# Patient Record
Sex: Female | Born: 1950 | Race: Black or African American | Hispanic: No | Marital: Single | Smoking: Never smoker
Health system: Southern US, Community
[De-identification: ages and names within clinical notes are randomized; demographics above are authoritative.]

## PROBLEM LIST (undated history)

## (undated) DIAGNOSIS — I10 Essential (primary) hypertension: Secondary | ICD-10-CM

## (undated) HISTORY — DX: Essential (primary) hypertension: I10

---

## 2020-05-15 DIAGNOSIS — I10 Essential (primary) hypertension: Secondary | ICD-10-CM | POA: Diagnosis not present

## 2020-05-15 DIAGNOSIS — Z6836 Body mass index (BMI) 36.0-36.9, adult: Secondary | ICD-10-CM | POA: Diagnosis not present

## 2020-08-13 DIAGNOSIS — Z1331 Encounter for screening for depression: Secondary | ICD-10-CM | POA: Diagnosis not present

## 2020-08-13 DIAGNOSIS — Z1389 Encounter for screening for other disorder: Secondary | ICD-10-CM | POA: Diagnosis not present

## 2020-08-13 DIAGNOSIS — Z0001 Encounter for general adult medical examination with abnormal findings: Secondary | ICD-10-CM | POA: Diagnosis not present

## 2020-08-13 DIAGNOSIS — Z79899 Other long term (current) drug therapy: Secondary | ICD-10-CM | POA: Diagnosis not present

## 2020-08-13 DIAGNOSIS — E785 Hyperlipidemia, unspecified: Secondary | ICD-10-CM | POA: Diagnosis not present

## 2020-08-13 DIAGNOSIS — I10 Essential (primary) hypertension: Secondary | ICD-10-CM | POA: Diagnosis not present

## 2020-08-13 DIAGNOSIS — Z6836 Body mass index (BMI) 36.0-36.9, adult: Secondary | ICD-10-CM | POA: Diagnosis not present

## 2020-09-12 DIAGNOSIS — I10 Essential (primary) hypertension: Secondary | ICD-10-CM | POA: Diagnosis not present

## 2020-09-12 DIAGNOSIS — E785 Hyperlipidemia, unspecified: Secondary | ICD-10-CM | POA: Diagnosis not present

## 2020-10-31 DIAGNOSIS — E785 Hyperlipidemia, unspecified: Secondary | ICD-10-CM | POA: Diagnosis not present

## 2020-10-31 DIAGNOSIS — R7303 Prediabetes: Secondary | ICD-10-CM | POA: Diagnosis not present

## 2020-10-31 DIAGNOSIS — I10 Essential (primary) hypertension: Secondary | ICD-10-CM | POA: Diagnosis not present

## 2020-11-28 DIAGNOSIS — I10 Essential (primary) hypertension: Secondary | ICD-10-CM | POA: Diagnosis not present

## 2020-11-28 DIAGNOSIS — E785 Hyperlipidemia, unspecified: Secondary | ICD-10-CM | POA: Diagnosis not present

## 2020-12-29 DIAGNOSIS — E785 Hyperlipidemia, unspecified: Secondary | ICD-10-CM | POA: Diagnosis not present

## 2020-12-29 DIAGNOSIS — I10 Essential (primary) hypertension: Secondary | ICD-10-CM | POA: Diagnosis not present

## 2021-02-07 ENCOUNTER — Ambulatory Visit (HOSPITAL_COMMUNITY)
Admission: RE | Admit: 2021-02-07 | Discharge: 2021-02-07 | Disposition: A | Discharge status: Home / Self Care | Payer: Medicare Other | Source: Ambulatory Visit | Attending: Gerontology | Admitting: Gerontology

## 2021-02-07 ENCOUNTER — Other Ambulatory Visit (HOSPITAL_COMMUNITY): Payer: Self-pay | Admitting: Gerontology

## 2021-02-07 DIAGNOSIS — M25562 Pain in left knee: Secondary | ICD-10-CM | POA: Diagnosis not present

## 2021-02-07 DIAGNOSIS — E785 Hyperlipidemia, unspecified: Secondary | ICD-10-CM | POA: Diagnosis not present

## 2021-02-07 DIAGNOSIS — I10 Essential (primary) hypertension: Secondary | ICD-10-CM | POA: Diagnosis not present

## 2021-02-07 DIAGNOSIS — R7303 Prediabetes: Secondary | ICD-10-CM | POA: Diagnosis not present

## 2021-02-07 DIAGNOSIS — Z1389 Encounter for screening for other disorder: Secondary | ICD-10-CM | POA: Diagnosis not present

## 2021-02-07 DIAGNOSIS — Z0001 Encounter for general adult medical examination with abnormal findings: Secondary | ICD-10-CM | POA: Diagnosis not present

## 2021-03-10 DIAGNOSIS — I10 Essential (primary) hypertension: Secondary | ICD-10-CM | POA: Diagnosis not present

## 2021-03-10 DIAGNOSIS — E785 Hyperlipidemia, unspecified: Secondary | ICD-10-CM | POA: Diagnosis not present

## 2021-03-11 ENCOUNTER — Other Ambulatory Visit: Payer: Medicare Other | Admitting: Obstetrics & Gynecology

## 2021-03-19 ENCOUNTER — Telehealth: Payer: Self-pay | Admitting: Orthopedic Surgery

## 2021-03-19 NOTE — Telephone Encounter (Signed)
Received referral.  I called and left a message for this patient to call our office back to reschedule an appointment.  Patient called back and we discussed her scheduling an appointment.  She said she has been told that there is nothing that the doctor can do for her leg.  She said she didn't want to pay a 45.00 copay to come in and he tell her that.  She then said that she was told that he could give her an injection but she wasn't sure that she wanted a "foreign substance" in her body.  She went on to say that she wanted him to diagnose her over the phone.  I told her that since he has never seen her, I felt like she needed to be seen and let the doctor exam her to make his diagnosis.  She said she didn't want to come in and pay 45.00 copay to come in and find out that he cant do anything for her.  She then stated that she "wanted to check around" and talk to others.  She said she would call us back.  I called Dr Letitia Neri office an spoke to [place] regarding this conversation

## 2021-03-21 ENCOUNTER — Ambulatory Visit (INDEPENDENT_AMBULATORY_CARE_PROVIDER_SITE_OTHER): Payer: Medicare Other | Admitting: Obstetrics and Gynecology

## 2021-03-21 ENCOUNTER — Encounter: Payer: Self-pay | Admitting: Obstetrics and Gynecology

## 2021-03-21 ENCOUNTER — Other Ambulatory Visit: Payer: Self-pay

## 2021-03-21 VITALS — BP 156/83 | HR 50 | Ht 67.0 in | Wt 227.0 lb

## 2021-03-21 DIAGNOSIS — Z0000 Encounter for general adult medical examination without abnormal findings: Secondary | ICD-10-CM | POA: Diagnosis not present

## 2021-03-21 DIAGNOSIS — R102 Pelvic and perineal pain: Secondary | ICD-10-CM

## 2021-03-21 NOTE — Progress Notes (Signed)
   History:  [name] is a 70 y.o. [id number] who presents to clinic today for establishing care.  Pain in lower pelvis that radiates down leg. Wants her ovaries checked out.  Pain is intermittent but is mainly in her LLQ and radiates down anterior thigh. It is not exacerbated by any leg movement. No pain with urination. Reports regular, daily bowel movements. Reports feeling pelvic fullness associated with pain.   Sees a PCP regularly   Last Pap was in her sixties. All normal pap smears. Has not been sexually active since her mid fifties. Reports that prior to menopause had normal periods. Has not had any breakthrough bleeding since menopause.   The following portions of the patient's history were reviewed and updated as appropriate: allergies, current medications, family history, past medical history, social history, past surgical history and problem list.  Review of Systems:  Review of Systems  Constitutional:  Negative for chills and fever.  Cardiovascular:  Negative for chest pain.  Gastrointestinal:  Negative for blood in stool, constipation, diarrhea and melena.  Genitourinary:  Negative for dysuria, flank pain and hematuria.  Musculoskeletal:  Negative for back pain.  Skin:  Negative for rash.  Neurological:  Negative for dizziness and headaches.     Objective:  Physical Exam BP (!) 156/83 (BP Location: Right Arm, Patient Position: Sitting, Cuff Size: Normal)   Pulse (!) 50   Ht 5\' 7"  (1.702 m)   Wt 227 lb (103 kg)   BMI 35.55 kg/m  Physical Exam Vitals and nursing note reviewed.  Constitutional:      Appearance: Normal appearance.  Cardiovascular:     Pulses: Normal pulses.  Pulmonary:     Effort: Pulmonary effort is normal.  Abdominal:     General: There is no distension.     Palpations: Abdomen is soft. There is no mass.     Tenderness: There is no abdominal tenderness. There is no right CVA tenderness, left CVA tenderness, guarding or rebound.      Hernia: No hernia is present.  Neurological:     Mental Status: She is alert.  Psychiatric:        Mood and Affect: Mood normal.        Behavior: Behavior normal.      Labs and Imaging No results found for this or any previous visit (from the past 24 hour(s)).  No results found.   Assessment & Plan:  1. Encounter for medical examination to establish care, Pelvic Pain -pap smear not indicated -no remarkable findings on exam but given patient reports of feeling fullness and pain, will send for TVUS.      , MD [facility] Family Medicine Fellow, [facility] for [facility], [facility]

## 2021-03-22 ENCOUNTER — Encounter: Payer: Self-pay | Admitting: Obstetrics and Gynecology

## 2021-03-31 ENCOUNTER — Ambulatory Visit (HOSPITAL_COMMUNITY)
Admission: RE | Admit: 2021-03-31 | Discharge: 2021-03-31 | Disposition: A | Discharge status: Home / Self Care | Payer: Medicare Other | Source: Ambulatory Visit | Attending: Obstetrics and Gynecology | Admitting: Obstetrics and Gynecology

## 2021-03-31 ENCOUNTER — Other Ambulatory Visit: Payer: Self-pay

## 2021-03-31 DIAGNOSIS — R102 Pelvic and perineal pain: Secondary | ICD-10-CM | POA: Diagnosis present

## 2021-03-31 DIAGNOSIS — Z780 Asymptomatic menopausal state: Secondary | ICD-10-CM | POA: Diagnosis not present

## 2021-04-07 ENCOUNTER — Telehealth: Payer: Self-pay

## 2021-04-07 NOTE — Telephone Encounter (Signed)
Pt called and needed more information about the [place] and why an endometrial biopsy was warranted. Answered some questions and pt wanted to speak with a doctor.

## 2021-04-08 NOTE — Telephone Encounter (Signed)
I talked with the patient at length regarding the thickened endometrium and my recommendation for getting an endometrial biopsy. The patient is transferred to [facility] and is going to make appt for endometrial biopsy

## 2021-04-09 DIAGNOSIS — E785 Hyperlipidemia, unspecified: Secondary | ICD-10-CM | POA: Diagnosis not present

## 2021-04-09 DIAGNOSIS — I10 Essential (primary) hypertension: Secondary | ICD-10-CM | POA: Diagnosis not present

## 2021-04-28 ENCOUNTER — Other Ambulatory Visit: Payer: Medicare Other | Admitting: Obstetrics & Gynecology

## 2021-05-10 DIAGNOSIS — I10 Essential (primary) hypertension: Secondary | ICD-10-CM | POA: Diagnosis not present

## 2021-05-10 DIAGNOSIS — E785 Hyperlipidemia, unspecified: Secondary | ICD-10-CM | POA: Diagnosis not present

## 2021-06-10 DIAGNOSIS — I10 Essential (primary) hypertension: Secondary | ICD-10-CM | POA: Diagnosis not present

## 2021-06-10 DIAGNOSIS — E785 Hyperlipidemia, unspecified: Secondary | ICD-10-CM | POA: Diagnosis not present

## 2021-06-24 DIAGNOSIS — I10 Essential (primary) hypertension: Secondary | ICD-10-CM | POA: Diagnosis not present

## 2021-06-24 DIAGNOSIS — R7303 Prediabetes: Secondary | ICD-10-CM | POA: Diagnosis not present

## 2021-06-24 DIAGNOSIS — E785 Hyperlipidemia, unspecified: Secondary | ICD-10-CM | POA: Diagnosis not present

## 2021-06-24 DIAGNOSIS — H1033 Unspecified acute conjunctivitis, bilateral: Secondary | ICD-10-CM | POA: Diagnosis not present

## 2021-08-20 DIAGNOSIS — I10 Essential (primary) hypertension: Secondary | ICD-10-CM | POA: Diagnosis not present

## 2021-08-20 DIAGNOSIS — M25512 Pain in left shoulder: Secondary | ICD-10-CM | POA: Diagnosis not present

## 2021-08-20 DIAGNOSIS — E785 Hyperlipidemia, unspecified: Secondary | ICD-10-CM | POA: Diagnosis not present

## 2021-09-19 DIAGNOSIS — E785 Hyperlipidemia, unspecified: Secondary | ICD-10-CM | POA: Diagnosis not present

## 2021-09-19 DIAGNOSIS — I10 Essential (primary) hypertension: Secondary | ICD-10-CM | POA: Diagnosis not present

## 2021-10-20 DIAGNOSIS — E785 Hyperlipidemia, unspecified: Secondary | ICD-10-CM | POA: Diagnosis not present

## 2021-10-20 DIAGNOSIS — I10 Essential (primary) hypertension: Secondary | ICD-10-CM | POA: Diagnosis not present

## 2021-11-18 DIAGNOSIS — E785 Hyperlipidemia, unspecified: Secondary | ICD-10-CM | POA: Diagnosis not present

## 2021-11-18 DIAGNOSIS — I10 Essential (primary) hypertension: Secondary | ICD-10-CM | POA: Diagnosis not present

## 2021-12-16 DIAGNOSIS — E785 Hyperlipidemia, unspecified: Secondary | ICD-10-CM | POA: Diagnosis not present

## 2021-12-16 DIAGNOSIS — I10 Essential (primary) hypertension: Secondary | ICD-10-CM | POA: Diagnosis not present

## 2021-12-28 IMAGING — US US PELVIS COMPLETE WITH TRANSVAGINAL
1 series · 13 of 25 positions shown · non-contrast
Comparison: None

CLINICAL DATA: Pelvic cramping and fullness, postmenopausal



[Series 1: us pelvic complete with transvaginal · 13 of 83 slices shown]
[im 1/83]
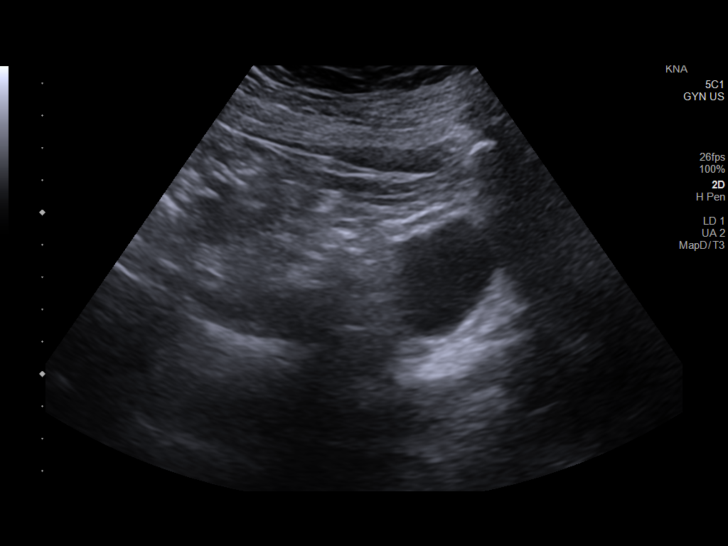
[im 7/83]
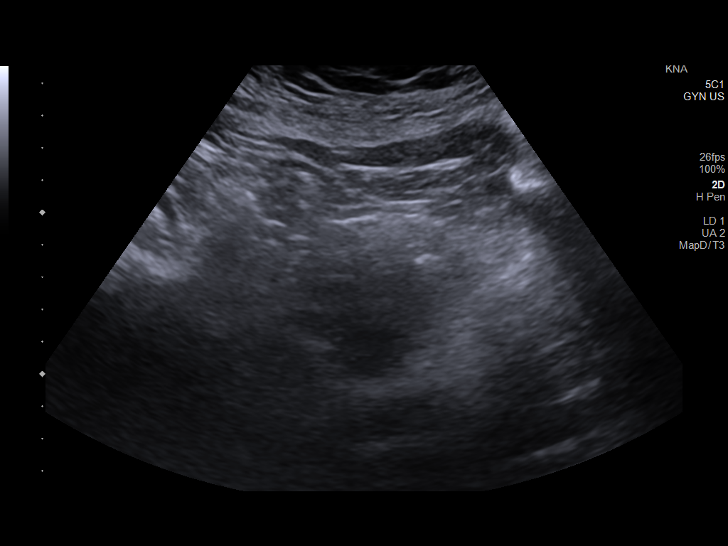
[im 14/83]
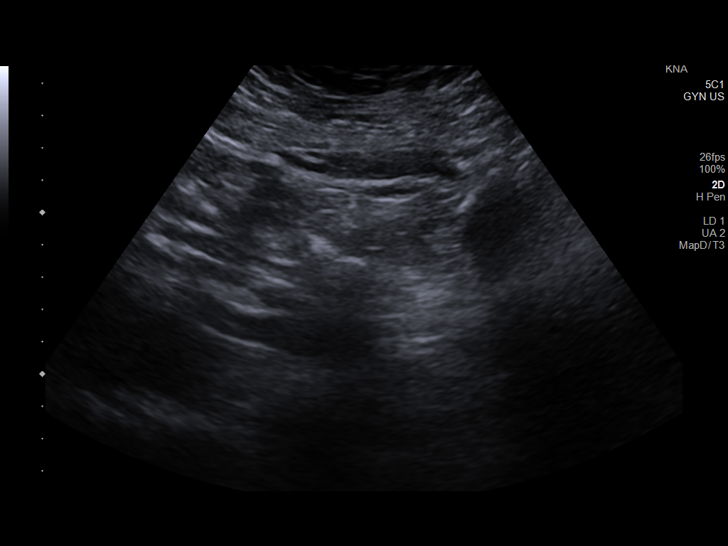
[im 21/83]
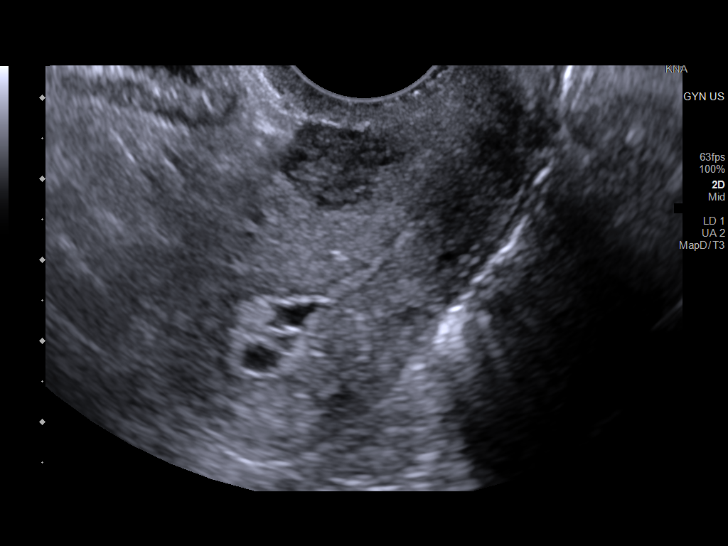
[im 28/83]
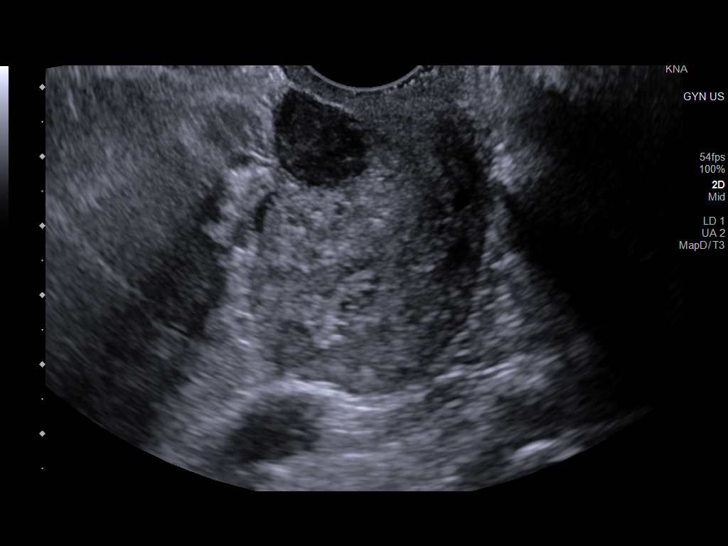
[im 35/83]
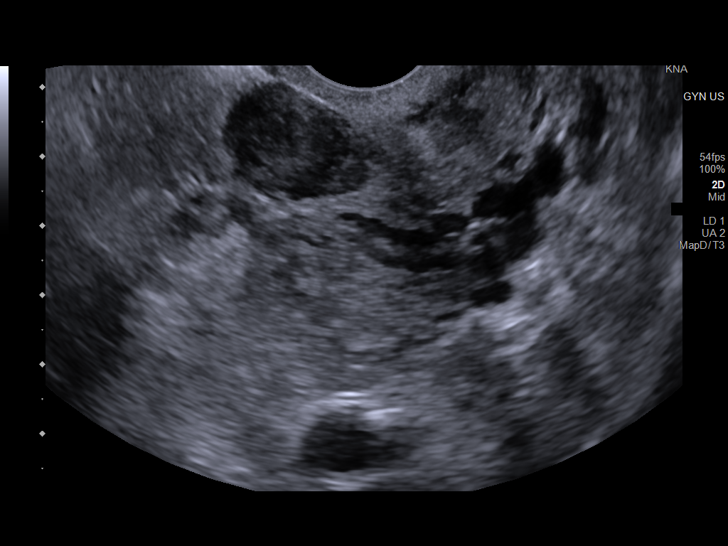
[im 42/83]
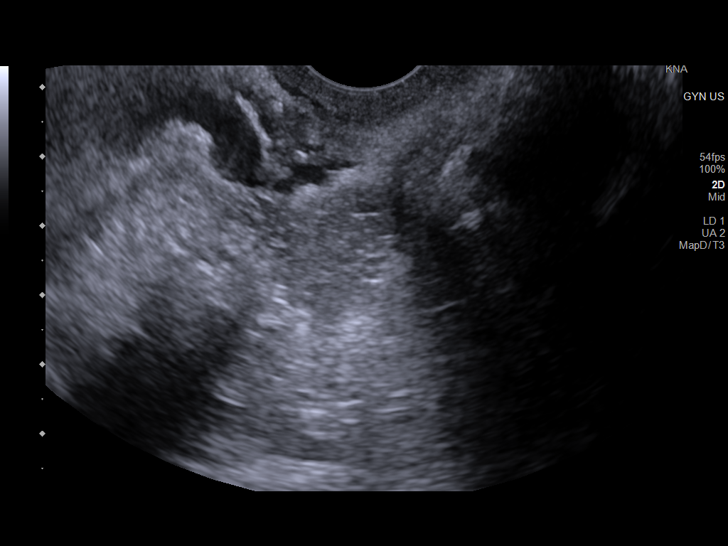
[im 48/83]
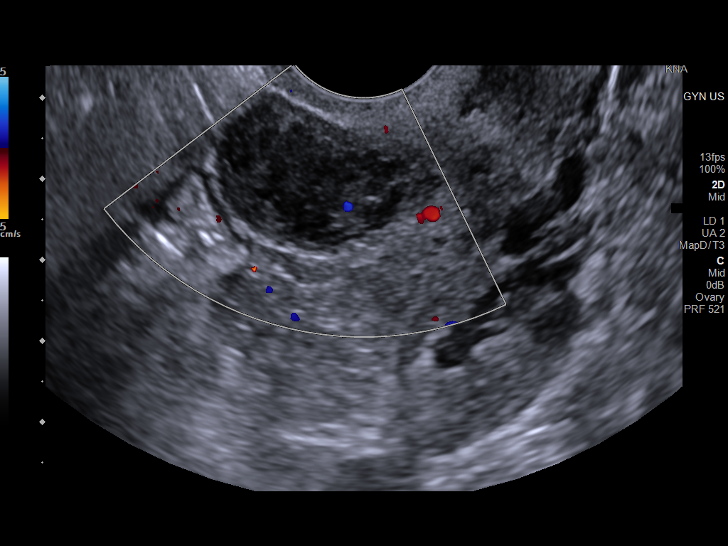
[im 55/83]
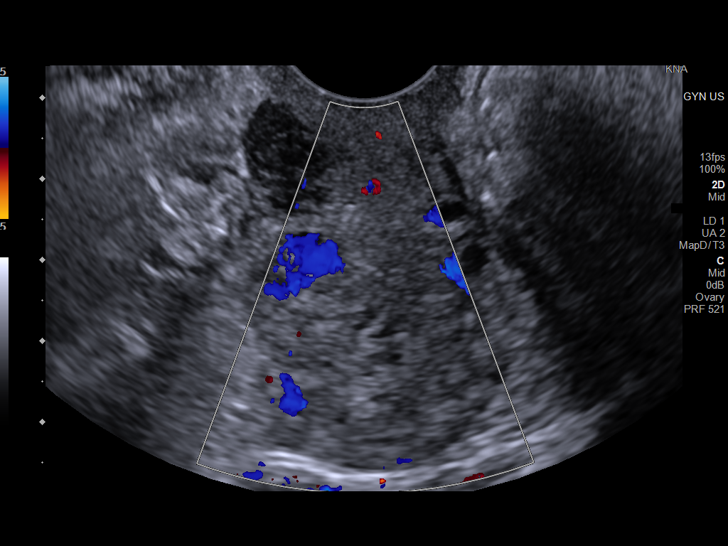
[im 62/83]
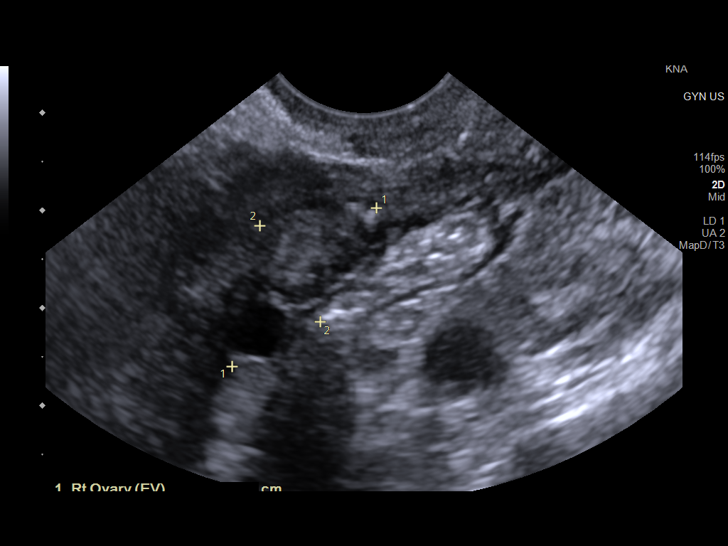
[im 69/83]
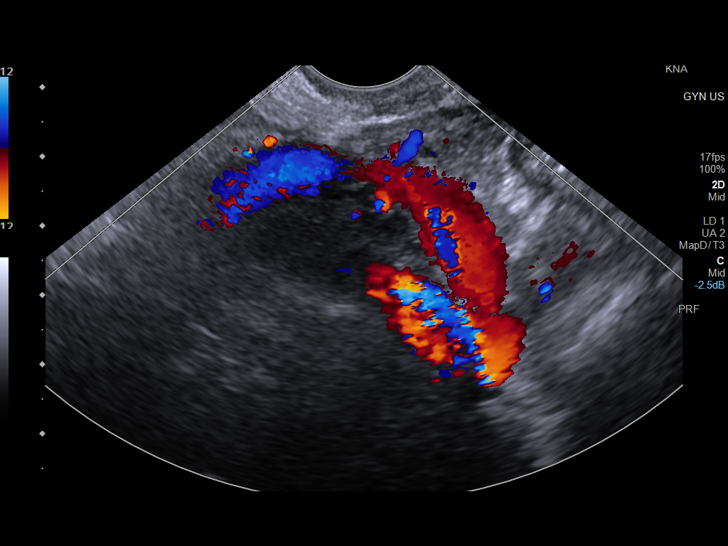
[im 76/83]
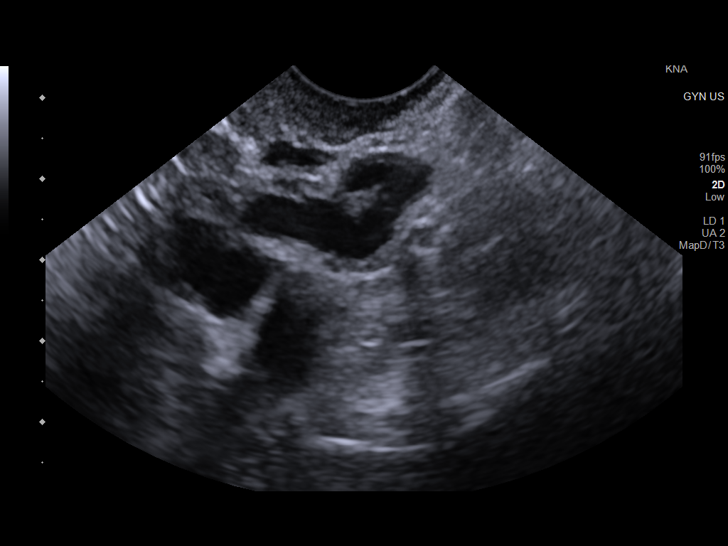
[im 83/83]
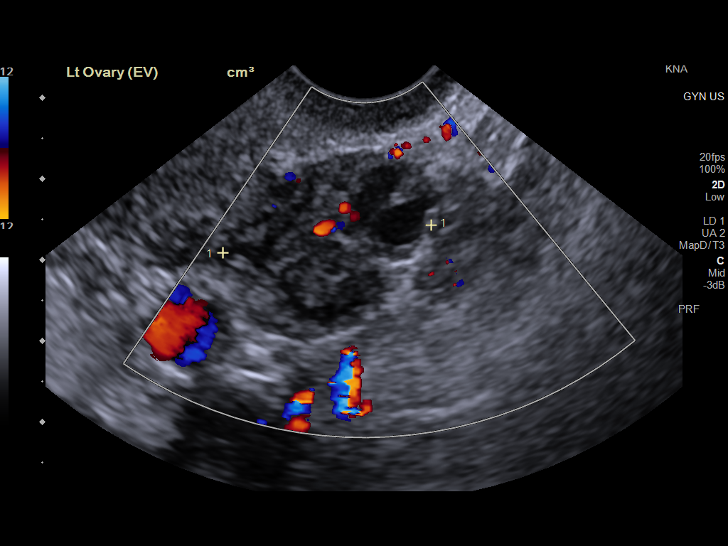

[13 of 25 positions shown; findings below may reference images not displayed]

FINDINGS: Uterus

Measurements: 6.0 x 3.1 x 4.4 cm = volume: 42 mL. Anteverted.
Slightly heterogeneous myometrium. Anterior wall subserosal
leiomyoma at lower uterine segment 2.4 cm diameter. No additional
uterine mass.

Endometrium

Thickness: 9 mm. Complex appearance of endometrial complex
containing irregular pockets of fluid. No discrete mass.

Right ovary

Measurements: 2.2 x 1.2 x 1.6 cm = volume: 2.1 mL. Normal morphology
without mass

Left ovary

Measurements: 3.0 x 1.9 x 2.6 cm = volume: 7.6 mL. Normal morphology
without mass

Other findings

Trace free pelvic fluid.  No adnexal masses.
IMPRESSION: 2.4 cm diameter exophytic leiomyoma at anterior lower uterus.

Thickened complex endometrial complex 9 mm thick containing
irregular pockets of fluid versus cystic change; endometrial
thickness is considered abnormal for an asymptomatic post-menopausal
female and endometrial sampling should be considered to exclude
carcinoma.

## 2022-01-16 DIAGNOSIS — I10 Essential (primary) hypertension: Secondary | ICD-10-CM | POA: Diagnosis not present

## 2022-01-16 DIAGNOSIS — E785 Hyperlipidemia, unspecified: Secondary | ICD-10-CM | POA: Diagnosis not present

## 2022-02-02 DIAGNOSIS — E785 Hyperlipidemia, unspecified: Secondary | ICD-10-CM | POA: Diagnosis not present

## 2022-02-02 DIAGNOSIS — R7303 Prediabetes: Secondary | ICD-10-CM | POA: Diagnosis not present

## 2022-02-02 DIAGNOSIS — I10 Essential (primary) hypertension: Secondary | ICD-10-CM | POA: Diagnosis not present

## 2022-02-02 DIAGNOSIS — Z0001 Encounter for general adult medical examination with abnormal findings: Secondary | ICD-10-CM | POA: Diagnosis not present

## 2022-02-02 DIAGNOSIS — Z1389 Encounter for screening for other disorder: Secondary | ICD-10-CM | POA: Diagnosis not present

## 2022-02-02 DIAGNOSIS — Z1159 Encounter for screening for other viral diseases: Secondary | ICD-10-CM | POA: Diagnosis not present

## 2022-03-07 DIAGNOSIS — I10 Essential (primary) hypertension: Secondary | ICD-10-CM | POA: Diagnosis not present

## 2022-03-07 DIAGNOSIS — E785 Hyperlipidemia, unspecified: Secondary | ICD-10-CM | POA: Diagnosis not present

## 2022-04-06 DIAGNOSIS — I10 Essential (primary) hypertension: Secondary | ICD-10-CM | POA: Diagnosis not present

## 2022-05-07 DIAGNOSIS — I10 Essential (primary) hypertension: Secondary | ICD-10-CM | POA: Diagnosis not present

## 2022-06-07 DIAGNOSIS — E782 Mixed hyperlipidemia: Secondary | ICD-10-CM | POA: Diagnosis not present

## 2022-06-07 DIAGNOSIS — I10 Essential (primary) hypertension: Secondary | ICD-10-CM | POA: Diagnosis not present

## 2022-07-07 DIAGNOSIS — I10 Essential (primary) hypertension: Secondary | ICD-10-CM | POA: Diagnosis not present

## 2022-07-07 DIAGNOSIS — E782 Mixed hyperlipidemia: Secondary | ICD-10-CM | POA: Diagnosis not present

## 2022-07-09 ENCOUNTER — Encounter: Payer: Self-pay | Admitting: *Deleted

## 2022-07-09 ENCOUNTER — Telehealth: Payer: Self-pay | Admitting: *Deleted

## 2022-07-09 NOTE — Patient Outreach (Signed)
  Care Coordination   Initial Visit Note   07/09/2022 Name: Melinda Murphy MRN: [id number] DOB: [birth year]  Melinda Murphy is a 71 y.o. year old female who sees Wynonia Hazard, NP for primary care. I spoke with  Melinda Murphy by phone today.  What matters to the patients health and wellness today?  Report she is doing well, state she is "healthy" and does not require follow up at this time.    Goals Addressed             This Visit's Progress    COMPLETED: Care Coordination Activities - No follow up needed       Care Coordination Interventions: Patient interviewed about adult health maintenance status including  Depression screen    Regular eye checkups Regular Dental Care    Blood Pressure    Advised patient to discuss  Pneumonia Vaccine Influenza Vaccine COVID vaccination    with primary care provider  Provided education about Overall health maintenance SDOH assessment complete Discussed need for yearly AWV         SDOH assessments and interventions completed:  Yes  SDOH Interventions Today    Flowsheet Row Most Recent Value  SDOH Interventions   Food Insecurity Interventions Intervention Not Indicated  Housing Interventions Intervention Not Indicated  Transportation Interventions Intervention Not Indicated  Utilities Interventions Intervention Not Indicated        Care Coordination Interventions Activated:  Yes  Care Coordination Interventions:  Yes, provided   Follow up plan: No further intervention required.   Encounter Outcome:  Pt. Visit Completed   Valente David, RN, MSN, [facility] Care Management Coordinator [phone]

## 2022-07-29 DIAGNOSIS — E782 Mixed hyperlipidemia: Secondary | ICD-10-CM | POA: Diagnosis not present

## 2022-07-29 DIAGNOSIS — I10 Essential (primary) hypertension: Secondary | ICD-10-CM | POA: Diagnosis not present

## 2022-08-28 DIAGNOSIS — E782 Mixed hyperlipidemia: Secondary | ICD-10-CM | POA: Diagnosis not present

## 2022-08-28 DIAGNOSIS — I10 Essential (primary) hypertension: Secondary | ICD-10-CM | POA: Diagnosis not present

## 2022-09-17 ENCOUNTER — Ambulatory Visit (HOSPITAL_COMMUNITY)
Admission: RE | Admit: 2022-09-17 | Discharge: 2022-09-17 | Disposition: A | Discharge status: Home / Self Care | Payer: Medicare Other | Source: Ambulatory Visit | Attending: Gerontology | Admitting: Gerontology

## 2022-09-17 ENCOUNTER — Other Ambulatory Visit (HOSPITAL_COMMUNITY): Payer: Self-pay | Admitting: Gerontology

## 2022-09-17 DIAGNOSIS — M25562 Pain in left knee: Secondary | ICD-10-CM | POA: Diagnosis not present

## 2022-09-18 ENCOUNTER — Ambulatory Visit: Payer: Medicare Other | Admitting: Physician Assistant

## 2022-09-18 ENCOUNTER — Other Ambulatory Visit: Payer: Self-pay | Admitting: Physician Assistant

## 2022-09-18 ENCOUNTER — Encounter: Payer: Self-pay | Admitting: Physician Assistant

## 2022-09-18 DIAGNOSIS — M1712 Unilateral primary osteoarthritis, left knee: Secondary | ICD-10-CM | POA: Diagnosis not present

## 2022-09-18 MED ORDER — BUPIVACAINE HCL 0.25 % IJ SOLN
2.0000 mL | INTRAMUSCULAR | Status: AC | PRN
  Administered 2022-09-18: 2 mL via INTRA_ARTICULAR

## 2022-09-18 MED ORDER — LIDOCAINE HCL 1 % IJ SOLN
2.0000 mL | INTRAMUSCULAR | Status: AC | PRN
  Administered 2022-09-18: 2 mL

## 2022-09-18 MED ORDER — METHYLPREDNISOLONE ACETATE 40 MG/ML IJ SUSP
40.0000 mg | INTRAMUSCULAR | Status: AC | PRN
  Administered 2022-09-18: 40 mg via INTRA_ARTICULAR

## 2022-09-18 MED ORDER — TRAMADOL HCL 50 MG PO TABS: 50.0000 mg | ORAL_TABLET | Freq: Two times a day (BID) | ORAL | 2 refills | Status: DC | PRN

## 2022-09-18 NOTE — Progress Notes (Signed)
Office Visit Note   Patient: Melinda Murphy           Date of Birth: November 19, 1950           MRN: [id number] Visit Date: 09/18/2022              Requested by: Trinidad Curet, NP [address] [place],  [place] [address] PCP: Trinidad Curet, NP   Assessment & Plan: Visit Diagnoses:  1. Unilateral primary osteoarthritis, left knee     Plan: Impression is left knee arthritis flareup.  Today, we discussed various treatment options to include left knee aspiration and cortisone injection versus viscosupplementation injection.  She would first like to try cortisone.  I aspirated approximately 35 cc of straw-colored fluid from the left knee.  I then injected the left knee with cortisone.  She tolerated this well.  Will send the fluid off for cell count and crystals as the patient would like to ensure she does not have gout.  Will call her with results.  She will follow-up with [place] as needed.  Follow-Up Instructions: Return if symptoms worsen or fail to improve.   Orders:  Orders Placed This Encounter  Procedures   Large Joint Inj: L knee   No orders of the defined types were placed in this encounter.     Procedures: Large Joint Inj: L knee on 09/18/2022 1:42 PM Indications: pain Details: 22 G needle, anterolateral approach Medications: 2 mL lidocaine 1 %; 2 mL bupivacaine 0.25 %; 40 mg methylPREDNISolone acetate 40 MG/ML      Clinical Data: No additional findings.   Subjective: Chief Complaint  Patient presents with   Left Knee - Pain    HPI patient is a 71 year old female comes in today with left knee pain for the past 2 months.  She denies any injury or change in activity.  No fevers or chills or history of gout.  The pain she has is to the entire knee.  Symptoms are worse when she goes from seated to standing position but does notice improvement after walking a few steps.  She has tried multiple over-the-counter medications without relief of pain.  No previous  cortisone injection to left knee.  Review of Systems as detailed in HPI.  All others reviewed and are negative.   Objective: Vital Signs: There were no vitals taken for this visit.  Physical Exam well-developed well-nourished female no acute distress.  Alert and oriented x 3.  Ortho Exam left knee exam shows a small to moderate effusion.  Range of motion 0 to 115 degrees.  Medial joint line tenderness.  Mild patellofemoral crepitus.  She is neurovascular intact distally.  Specialty Comments:  No specialty comments available.  Imaging: DG Knee Complete 4 Views Left  Result Date: 09/17/2022 CLINICAL DATA:  LEFT knee pain for 1 week, pain and stiffness in the morning EXAM: LEFT KNEE - COMPLETE 4+ VIEW COMPARISON:  02/07/2021 FINDINGS: Borderline decreased osseous mineralization. Diffuse joint space narrowing. Marginal articular spurs. No fracture, dislocation, or bone destruction. Patellar enthesophytes at quadriceps and patellar tendon insertions with prominent tibial tubercle noted as well. No joint effusion. IMPRESSION: Tricompartmental osteoarthritic changes LEFT knee. No acute abnormalities. Electronically Signed   By: Ulyses Southward M.D.   On: 09/17/2022 18:02     PMFS History: There are no problems to display for this patient.  Past Medical History:  Diagnosis Date   Hypertension     Family History  Problem Relation Age of Onset  Aneurysm Mother     History reviewed. No pertinent surgical history. Social History   Occupational History   Not on file  Tobacco Use   Smoking status: Never   Smokeless tobacco: Never  Vaping Use   Vaping Use: Never used  Substance and Sexual Activity   Alcohol use: Never   Drug use: Never   Sexual activity: Not Currently    Birth control/protection: Abstinence

## 2022-09-18 NOTE — Addendum Note (Signed)
Addended by: Wendi Maya on: 09/18/2022 02:35 PM   Modules accepted: Orders

## 2022-09-19 LAB — SYNOVIAL FLUID ANALYSIS, COMPLETE
Basophils, %: 0 %
Eosinophils-Synovial: 0 % (ref 0–2)
Lymphocytes-Synovial Fld: 33 % (ref 0–74)
Monocyte/Macrophage: 5 % (ref 0–69)
Neutrophil, Synovial: 62 % — ABNORMAL HIGH (ref 0–24)
Synoviocytes, %: 0 % (ref 0–15)
WBC, Synovial: 3514 cells/uL — ABNORMAL HIGH (ref <150)

## 2022-09-22 ENCOUNTER — Telehealth: Payer: Self-pay | Admitting: Orthopaedic Surgery

## 2022-09-22 NOTE — Telephone Encounter (Signed)
Spoke to patient

## 2022-09-22 NOTE — Telephone Encounter (Signed)
This is a Xu pt. Thanks!

## 2022-09-22 NOTE — Progress Notes (Signed)
Can you let patient know knee fluid just shows inflammation.

## 2022-09-22 NOTE — Telephone Encounter (Signed)
Please call patient..states she is returning a call from [place]..[phone]

## 2022-09-28 ENCOUNTER — Other Ambulatory Visit (HOSPITAL_COMMUNITY): Payer: Self-pay | Admitting: Physician Assistant

## 2022-09-28 ENCOUNTER — Telehealth: Payer: Self-pay | Admitting: Physician Assistant

## 2022-09-28 DIAGNOSIS — I10 Essential (primary) hypertension: Secondary | ICD-10-CM | POA: Diagnosis not present

## 2022-09-28 DIAGNOSIS — M25561 Pain in right knee: Secondary | ICD-10-CM | POA: Diagnosis not present

## 2022-09-28 NOTE — Telephone Encounter (Signed)
Assuming this is tramadol looking at her med list so I sent this in

## 2022-09-28 NOTE — Telephone Encounter (Signed)
Patient wants to get a refill on her medication for arthritis.

## 2022-10-20 ENCOUNTER — Telehealth: Payer: Self-pay | Admitting: Physician Assistant

## 2022-10-20 NOTE — Telephone Encounter (Signed)
Next step would be to try a visco injection since the cortisone has not helped long enough

## 2022-10-20 NOTE — Telephone Encounter (Signed)
Pt called requesting a call from [place]. Pt states she need to know what next steps sre for her knee. She states still severe pains and stiffness. Please call pt at [phone].

## 2022-10-21 NOTE — Telephone Encounter (Signed)
VOB submitted for Durolane, bilateral knee.  

## 2022-10-26 ENCOUNTER — Telehealth: Payer: Self-pay | Admitting: Physician Assistant

## 2022-10-26 NOTE — Telephone Encounter (Signed)
Patient states she wants another shot. Please advise

## 2022-10-27 NOTE — Telephone Encounter (Signed)
We cannot do whether insurance pays or not.  Happy to inject with toradol if she would like, but it will likely not help very much

## 2022-10-29 ENCOUNTER — Telehealth: Payer: Self-pay

## 2022-10-29 DIAGNOSIS — M25561 Pain in right knee: Secondary | ICD-10-CM | POA: Diagnosis not present

## 2022-10-29 DIAGNOSIS — I10 Essential (primary) hypertension: Secondary | ICD-10-CM | POA: Diagnosis not present

## 2022-10-29 DIAGNOSIS — M1712 Unilateral primary osteoarthritis, left knee: Secondary | ICD-10-CM

## 2022-10-29 DIAGNOSIS — M1711 Unilateral primary osteoarthritis, right knee: Secondary | ICD-10-CM

## 2022-10-29 NOTE — Telephone Encounter (Signed)
Called and left a VM for patient to CB to schedule for gel injection with Dr. Xu.  Check referral tab  

## 2022-11-11 ENCOUNTER — Encounter: Payer: Self-pay | Admitting: Orthopaedic Surgery

## 2022-11-11 ENCOUNTER — Ambulatory Visit: Payer: Medicare Other | Admitting: Orthopaedic Surgery

## 2022-11-11 DIAGNOSIS — M1712 Unilateral primary osteoarthritis, left knee: Secondary | ICD-10-CM

## 2022-11-11 DIAGNOSIS — M170 Bilateral primary osteoarthritis of knee: Secondary | ICD-10-CM | POA: Diagnosis not present

## 2022-11-11 DIAGNOSIS — M1711 Unilateral primary osteoarthritis, right knee: Secondary | ICD-10-CM

## 2022-11-11 MED ORDER — SODIUM HYALURONATE 60 MG/3ML IX PRSY
60.0000 mg | PREFILLED_SYRINGE | INTRA_ARTICULAR | Status: AC | PRN
  Administered 2022-11-11: 60 mg via INTRA_ARTICULAR

## 2022-11-11 MED ORDER — BUPIVACAINE HCL 0.5 % IJ SOLN
2.0000 mL | INTRAMUSCULAR | Status: AC | PRN
  Administered 2022-11-11: 2 mL via INTRA_ARTICULAR

## 2022-11-11 MED ORDER — LIDOCAINE HCL 1 % IJ SOLN
2.0000 mL | INTRAMUSCULAR | Status: AC | PRN
  Administered 2022-11-11: 2 mL

## 2022-11-11 NOTE — Progress Notes (Signed)
   Office Visit Note   Patient: Melinda Murphy           Date of Birth: 02-07-1951           MRN: [id number] Visit Date: 11/11/2022              Requested by: Wynonia Hazard, NP [address] [place],  [place] [address] PCP: Wynonia Hazard, NP   Assessment & Plan: Visit Diagnoses:  1. Bilateral primary osteoarthritis of knee     Plan: Impression is bilateral knee osteoarthritis.  Today we proceeded with bilateral knee Durolane injections.  She tolerated these well.  She will follow-up with [place] as needed.  Follow-Up Instructions: Return if symptoms worsen or fail to improve.   Orders:  No orders of the defined types were placed in this encounter.  No orders of the defined types were placed in this encounter.     Procedures: Large Joint Inj: bilateral knee on 11/11/2022 6:45 PM Indications: pain Details: 22 G needle  Arthrogram: No  Medications (Right): 2 mL lidocaine 1 %; 2 mL bupivacaine 0.5 %; 60 mg Sodium Hyaluronate 60 MG/3ML Medications (Left): 2 mL lidocaine 1 %; 2 mL bupivacaine 0.5 %; 60 mg Sodium Hyaluronate 60 MG/3ML Outcome: tolerated well, no immediate complications Patient was prepped and draped in the usual sterile fashion.       Clinical Data: No additional findings.   Subjective: Chief Complaint  Patient presents with   Right Knee - Pain    Durolane   Left Knee - Pain    Durolane    HPI patient is a pleasant 72 year old female with bilateral knee osteoarthritis who comes in today for bilateral knee Durolane injections.  No previous viscosupplementation injection.  Previous cortisone injection to the left knee provided good but temporary relief which only lasted for about 3 weeks.     Objective: Vital Signs: There were no vitals taken for this visit.    Ortho Exam bilateral knee exam shows small effusions.  Otherwise unchanged.  Specialty Comments:  No specialty comments available.  Imaging: No new imaging   PMFS  History: There are no problems to display for this patient.  Past Medical History:  Diagnosis Date   Hypertension     Family History  Problem Relation Age of Onset   Aneurysm Mother     History reviewed. No pertinent surgical history. Social History   Occupational History   Not on file  Tobacco Use   Smoking status: Never   Smokeless tobacco: Never  Vaping Use   Vaping Use: Never used  Substance and Sexual Activity   Alcohol use: Never   Drug use: Never   Sexual activity: Not Currently    Birth control/protection: Abstinence

## 2022-11-16 ENCOUNTER — Other Ambulatory Visit: Payer: Self-pay | Admitting: Physician Assistant

## 2022-11-17 ENCOUNTER — Telehealth: Payer: Self-pay | Admitting: Physician Assistant

## 2022-11-17 ENCOUNTER — Other Ambulatory Visit: Payer: Self-pay | Admitting: Physician Assistant

## 2022-11-17 MED ORDER — TRAMADOL HCL 50 MG PO TABS: 50.0000 mg | ORAL_TABLET | Freq: Two times a day (BID) | ORAL | 2 refills | Status: DC | PRN

## 2022-11-17 NOTE — Telephone Encounter (Signed)
Pt called requesting a refill of tramadol. Pt state she is in sever pains still with injection. Please send medication to pharmacy on file. Pt is asking for PA [place] to call her at [phone].

## 2022-11-17 NOTE — Telephone Encounter (Signed)
sent 

## 2022-11-17 NOTE — Telephone Encounter (Signed)
Patient called in requesting for medication again please advise

## 2022-11-23 ENCOUNTER — Telehealth: Payer: Self-pay | Admitting: Orthopaedic Surgery

## 2022-11-23 NOTE — Telephone Encounter (Signed)
I'm afraid not.  She's bone on bone

## 2022-11-23 NOTE — Telephone Encounter (Signed)
Patient states she has questions about her last knee injection. Please advise

## 2022-11-23 NOTE — Telephone Encounter (Signed)
Tried to call patient. No answer. LMOM for patient to call me back.

## 2022-11-23 NOTE — Telephone Encounter (Signed)
Patient states that the gel injection did not help.  Cortisone injection helped for 3 weeks.  Is there any other recommendations aside from TKA?

## 2022-11-24 ENCOUNTER — Telehealth: Payer: Self-pay | Admitting: Physician Assistant

## 2022-11-24 NOTE — Telephone Encounter (Signed)
Pt called stating Melinda Murphy called her. Please call pt at [phone].

## 2022-11-24 NOTE — Telephone Encounter (Signed)
Tried patient again. No answer. LMOM for patient to call me back.

## 2022-11-24 NOTE — Telephone Encounter (Signed)
Patient tried to call back per front desk staff. I called her back. No answer.

## 2022-11-25 ENCOUNTER — Telehealth: Payer: Self-pay

## 2022-11-25 NOTE — Telephone Encounter (Signed)
Spoke with patient. Explained that she is bone on bone. Surgery would be her only option at this point since injections are not helping. I offered her an appointment to come in so that she can get an idea of the procedure and what to expect, she declined.

## 2022-11-27 DIAGNOSIS — I10 Essential (primary) hypertension: Secondary | ICD-10-CM | POA: Diagnosis not present

## 2022-11-27 DIAGNOSIS — M25561 Pain in right knee: Secondary | ICD-10-CM | POA: Diagnosis not present

## 2022-12-02 DIAGNOSIS — M9902 Segmental and somatic dysfunction of thoracic region: Secondary | ICD-10-CM | POA: Diagnosis not present

## 2022-12-02 DIAGNOSIS — M9903 Segmental and somatic dysfunction of lumbar region: Secondary | ICD-10-CM | POA: Diagnosis not present

## 2022-12-02 DIAGNOSIS — M62830 Muscle spasm of back: Secondary | ICD-10-CM | POA: Diagnosis not present

## 2022-12-02 DIAGNOSIS — M9905 Segmental and somatic dysfunction of pelvic region: Secondary | ICD-10-CM | POA: Diagnosis not present

## 2022-12-09 ENCOUNTER — Telehealth: Payer: Self-pay | Admitting: Physician Assistant

## 2022-12-09 DIAGNOSIS — M62830 Muscle spasm of back: Secondary | ICD-10-CM | POA: Diagnosis not present

## 2022-12-09 DIAGNOSIS — M9902 Segmental and somatic dysfunction of thoracic region: Secondary | ICD-10-CM | POA: Diagnosis not present

## 2022-12-09 DIAGNOSIS — M9903 Segmental and somatic dysfunction of lumbar region: Secondary | ICD-10-CM | POA: Diagnosis not present

## 2022-12-09 DIAGNOSIS — M9905 Segmental and somatic dysfunction of pelvic region: Secondary | ICD-10-CM | POA: Diagnosis not present

## 2022-12-09 NOTE — Telephone Encounter (Signed)
Pt called requesting a called from [place] to leave a voicemail of names and spelling of both injections in her knee last visit. Please call pt with this info sometime today. Pt phone number is [phone].

## 2022-12-09 NOTE — Telephone Encounter (Signed)
Called and left message for patient. She received Durolane injections in February 2024.

## 2022-12-16 DIAGNOSIS — M62830 Muscle spasm of back: Secondary | ICD-10-CM | POA: Diagnosis not present

## 2022-12-16 DIAGNOSIS — M9903 Segmental and somatic dysfunction of lumbar region: Secondary | ICD-10-CM | POA: Diagnosis not present

## 2022-12-16 DIAGNOSIS — M9905 Segmental and somatic dysfunction of pelvic region: Secondary | ICD-10-CM | POA: Diagnosis not present

## 2022-12-16 DIAGNOSIS — M9902 Segmental and somatic dysfunction of thoracic region: Secondary | ICD-10-CM | POA: Diagnosis not present

## 2022-12-28 DIAGNOSIS — M25561 Pain in right knee: Secondary | ICD-10-CM | POA: Diagnosis not present

## 2022-12-28 DIAGNOSIS — I10 Essential (primary) hypertension: Secondary | ICD-10-CM | POA: Diagnosis not present

## 2023-01-02 ENCOUNTER — Emergency Department (HOSPITAL_COMMUNITY)
Admission: EM | Admit: 2023-01-02 | Discharge: 2023-01-02 | Disposition: A | Discharge status: Home / Self Care | Payer: Medicare Other | Attending: Emergency Medicine | Admitting: Emergency Medicine

## 2023-01-02 ENCOUNTER — Emergency Department (HOSPITAL_COMMUNITY): Payer: Medicare Other

## 2023-01-02 DIAGNOSIS — R609 Edema, unspecified: Secondary | ICD-10-CM | POA: Diagnosis not present

## 2023-01-02 DIAGNOSIS — S93492A Sprain of other ligament of left ankle, initial encounter: Secondary | ICD-10-CM | POA: Diagnosis not present

## 2023-01-02 DIAGNOSIS — I10 Essential (primary) hypertension: Secondary | ICD-10-CM | POA: Diagnosis not present

## 2023-01-02 DIAGNOSIS — Z743 Need for continuous supervision: Secondary | ICD-10-CM | POA: Diagnosis not present

## 2023-01-02 DIAGNOSIS — R6889 Other general symptoms and signs: Secondary | ICD-10-CM | POA: Diagnosis not present

## 2023-01-02 DIAGNOSIS — M7989 Other specified soft tissue disorders: Secondary | ICD-10-CM | POA: Diagnosis not present

## 2023-01-02 DIAGNOSIS — M79605 Pain in left leg: Secondary | ICD-10-CM | POA: Diagnosis not present

## 2023-01-02 DIAGNOSIS — S93402A Sprain of unspecified ligament of left ankle, initial encounter: Secondary | ICD-10-CM | POA: Diagnosis not present

## 2023-01-02 DIAGNOSIS — R600 Localized edema: Secondary | ICD-10-CM | POA: Diagnosis not present

## 2023-01-02 DIAGNOSIS — X501XXA Overexertion from prolonged static or awkward postures, initial encounter: Secondary | ICD-10-CM | POA: Insufficient documentation

## 2023-01-02 DIAGNOSIS — S99912A Unspecified injury of left ankle, initial encounter: Secondary | ICD-10-CM | POA: Diagnosis present

## 2023-01-02 DIAGNOSIS — S8992XA Unspecified injury of left lower leg, initial encounter: Secondary | ICD-10-CM | POA: Diagnosis not present

## 2023-01-02 MED ORDER — NAPROXEN 500 MG PO TABS: 500.0000 mg | ORAL_TABLET | Freq: Two times a day (BID) | ORAL | 0 refills | Status: DC

## 2023-01-02 MED ORDER — NAPROXEN 250 MG PO TABS
500.0000 mg | ORAL_TABLET | Freq: Once | ORAL | Status: AC
  Administered 2023-01-02: 500 mg via ORAL
  Filled 2023-01-02: qty 2

## 2023-01-02 NOTE — Discharge Instructions (Signed)
RICE therapy:  Apply ice wrapped in a towel intermittently keeping it on the skin no longer than 10 minutes a couple of times an hour Elevate the affected extremity to help reduce blood flow and prevent swelling Use an anti-inflammatory if you are not allergic to it such as ibuprofen or Naprosyn to help with pain and swelling Use a compressive device whether it is an Ace wrap or an ankle sprain immobilizer to help minimize movement and compress the swelling  Please take Naprosyn, 500mg  by mouth twice daily as needed for pain - this in an antiinflammatory medicine (NSAID) and is similar to ibuprofen - many people feel that it is stronger than ibuprofen and it is easier to take since it is a smaller pill.  Please use this only for 1 week - if your pain persists, you will need to follow up with your doctor in the office for ongoing guidance and pain control.  Your x-rays are normal, no broken bones  Thank you for allowing [place] to treat you in the emergency department today.  After reviewing your examination and potential testing that was done it appears that you are safe to go home.  I would like for you to follow-up with your doctor within the next several days, have them obtain your results and follow-up with them to review all of these tests.  If you should develop severe or worsening symptoms return to the emergency department immediately

## 2023-01-02 NOTE — ED Triage Notes (Signed)
Pt arrived via [facility] c/o L lower leg and ankle pain from a fall that occurred yesterday when she was picking up laundry, denies hitting head, denies LOC. L lower leg appears swollen

## 2023-01-02 NOTE — ED Provider Notes (Signed)
[facility] EMERGENCY DEPARTMENT AT [facility] Provider Note   CSN: [id number] Arrival date & time: 01/02/23  1653     History  Chief Complaint  Patient presents with   Leg Pain    Melinda Murphy is a 73 y.o. female.   Leg Pain    This patient is a 72 year old female, she reports that she has a history of hypertension and takes medications, she does take no anticoagulants or antiplatelet agents.  Yesterday while she was doing laundry she slipped, her left leg twisted and bent underneath her causing pain in her ankle, she was able to get up and walk but today when she got out of bed the pain was worse, she noticed swelling in her ankle and her lower leg, she denies any knee pain hip pain or any other injuries.  She has had no medications prior to arrival.  She comes by EMS  Home Medications Prior to Admission medications   Medication Sig Start Date End Date Taking? Authorizing Provider  naproxen (NAPROSYN) 500 MG tablet Take 1 tablet (500 mg total) by mouth 2 (two) times daily with a meal. 01/02/23  Yes Eber Hong, MD  traMADol (ULTRAM) 50 MG tablet Take 1 tablet (50 mg total) by mouth 2 (two) times daily as needed. 11/17/22   Cristie Hem, PA-C  triamterene-hydrochlorothiazide (MAXZIDE-25) 37.5-25 MG tablet Take 1 tablet by mouth daily. 02/17/21   [provider]      Allergies    Patient has no known allergies.    Review of Systems   Review of Systems  Musculoskeletal:  Positive for gait problem and joint swelling.  Skin:  Negative for wound.  Neurological:  Negative for weakness and numbness.    Physical Exam Updated Vital Signs BP (!) 164/80 (BP Location: Right Arm)   Pulse 65   Temp 99.2 F (37.3 C) (Oral)   Resp 18   Ht 1.702 m ( )   Wt 100.7 kg   SpO2 98%   BMI 34.77 kg/m  Physical Exam Vitals and nursing note reviewed.  Constitutional:      General: She is not in acute distress.    Appearance: She is well-developed.  HENT:      Head: Normocephalic and atraumatic.     Mouth/Throat:     Pharynx: No oropharyngeal exudate.  Eyes:     General: No scleral icterus.       Right eye: No discharge.        Left eye: No discharge.     Conjunctiva/sclera: Conjunctivae normal.     Pupils: Pupils are equal, round, and reactive to light.  Neck:     Thyroid: No thyromegaly.     Vascular: No JVD.  Cardiovascular:     Rate and Rhythm: Normal rate and regular rhythm.     Heart sounds: Normal heart sounds. No murmur heard.    No friction rub. No gallop.  Pulmonary:     Effort: Pulmonary effort is normal. No respiratory distress.     Breath sounds: Normal breath sounds. No wheezing or rales.  Abdominal:     General: Bowel sounds are normal. There is no distension.     Palpations: Abdomen is soft. There is no mass.     Tenderness: There is no abdominal tenderness.  Musculoskeletal:        General: No tenderness. Normal range of motion.     Cervical back: Normal range of motion and neck supple.  Comments: There is tenderness over both the medial and lateral malleolus, there is generalized swelling of the mid left lower extremity from the mid tib-fib down through the foot.  There is normal pulses at the foot, there is no tenderness over the bones of the foot including the base of the fifth metatarsal or the proximal midfoot.  There is no bruising of the ankle or distal leg, the knee is normal on the left side, she is able to straight leg raise without difficulty and can put her knee and hip through full range of motion without pain.  Lymphadenopathy:     Cervical: No cervical adenopathy.  Skin:    General: Skin is warm and dry.     Findings: No erythema or rash.     Comments: There is no bruising or opening of the skin, no lacerations or contusions or ecchymosis  Neurological:     Mental Status: She is alert.     Coordination: Coordination normal.  Psychiatric:        Behavior: Behavior normal.     ED Results /  Procedures / Treatments   Labs (all labs ordered are listed, but only abnormal results are displayed) Labs Reviewed - No data to display  EKG None  Radiology DG Foot Complete Left  Result Date: 01/02/2023 CLINICAL DATA:  Trauma, swelling. EXAM: LEFT FOOT - COMPLETE 3+ VIEW COMPARISON:  None Available. FINDINGS: There is no evidence of fracture or dislocation. Small corticated density adjacent to the lateral calcaneus may represent an accessory ossicle or sequela of remote injury. There is no evidence of arthropathy or other focal bone abnormality. No erosive change. Generalized subcutaneous edema, most prominent over the dorsum of the foot. IMPRESSION: Soft tissue edema without acute fracture or dislocation. Electronically Signed   By: Narda Rutherford M.D.   On: 01/02/2023 18:31   DG Tibia/Fibula Left  Result Date: 01/02/2023 CLINICAL DATA:  Trauma, swelling. Lower leg and ankle pain, fall yesterday. EXAM: LEFT TIBIA AND FIBULA - 2 VIEW COMPARISON:  None Available. FINDINGS: There is no evidence of fracture or other focal bone lesions. Cortical margins of the tibia and fibular intact. No erosive change. Knee alignment is maintained. There is generalized subcutaneous edema. No radiopaque foreign body. IMPRESSION: Soft tissue edema. No fracture. Electronically Signed   By: Narda Rutherford M.D.   On: 01/02/2023 18:29    Procedures Procedures    Medications Ordered in ED Medications  naproxen (NAPROSYN) tablet 500 mg (has no administration in time range)    ED Course/ Medical Decision Making/ A&P                             Medical Decision Making Amount and/or Complexity of Data Reviewed Radiology: ordered.  Risk Prescription drug management.   Injury sustained to left lower extremity, rule out fracture versus sprain, patient agreeable  Imaging: I personally viewed and interpreted the x-rays of the left ankle which show no signs of fractures of the tibia fibula or foot  bones.  Splinting placed by nursing, I examined the patient after the Ace wrap was placed, normal blood flow and sensation, she was given crutches and was able to ambulate with crutches, this patient is stable for discharge, vital signs are unremarkable except for mild hypertension but a very little concern at this time.  Patient informed of her results and expressed her understanding  I have discussed with the patient at the bedside the results,  and the meaning of these results.  They have expressed her understanding to the need for follow-up with primary care physician         Final Clinical Impression(s) / ED Diagnoses Final diagnoses:  High ankle sprain, left, initial encounter    Rx / DC Orders ED Discharge Orders          Ordered    naproxen (NAPROSYN) 500 MG tablet  2 times daily with meals        01/02/23 1840              Eber Hong, MD 01/02/23 [phone]

## 2023-01-06 ENCOUNTER — Telehealth: Payer: Self-pay

## 2023-01-06 NOTE — Telephone Encounter (Signed)
        Patient  visited [facility] on 01/02/2023  for High ankle sprain, left.   Telephone encounter attempt :  1st  No answer unable to leave message.    [facility]  [facility] Population Health Community Resource Care Guide   [email or web address]  ?? [phone]   Website: [email or web address]  [email or web address]

## 2023-01-06 NOTE — Telephone Encounter (Signed)
        Patient  visited [facility] on 01/02/2023  for leg pain.   Telephone encounter attempt :  2nd  A HIPAA compliant voice message was left requesting a return call.  Instructed patient to call back at [phone].    [facility]  [facility] Population Health Community Resource Care Guide   [email or web address]  ?? [phone]   Website: [email or web address]  [email or web address]

## 2023-02-01 DIAGNOSIS — Z1389 Encounter for screening for other disorder: Secondary | ICD-10-CM | POA: Diagnosis not present

## 2023-02-01 DIAGNOSIS — Z91199 Patient's noncompliance with other medical treatment and regimen due to unspecified reason: Secondary | ICD-10-CM | POA: Diagnosis not present

## 2023-02-01 DIAGNOSIS — I10 Essential (primary) hypertension: Secondary | ICD-10-CM | POA: Diagnosis not present

## 2023-02-01 DIAGNOSIS — M1711 Unilateral primary osteoarthritis, right knee: Secondary | ICD-10-CM | POA: Diagnosis not present

## 2023-02-01 DIAGNOSIS — Z0001 Encounter for general adult medical examination with abnormal findings: Secondary | ICD-10-CM | POA: Diagnosis not present

## 2023-02-01 DIAGNOSIS — E785 Hyperlipidemia, unspecified: Secondary | ICD-10-CM | POA: Diagnosis not present

## 2023-02-01 DIAGNOSIS — R771 Abnormality of globulin: Secondary | ICD-10-CM | POA: Diagnosis not present

## 2023-02-01 DIAGNOSIS — E782 Mixed hyperlipidemia: Secondary | ICD-10-CM | POA: Diagnosis not present

## 2023-02-01 DIAGNOSIS — R7303 Prediabetes: Secondary | ICD-10-CM | POA: Diagnosis not present

## 2023-02-02 ENCOUNTER — Other Ambulatory Visit (HOSPITAL_COMMUNITY): Payer: Self-pay | Admitting: Gerontology

## 2023-02-02 DIAGNOSIS — N183 Chronic kidney disease, stage 3 unspecified: Secondary | ICD-10-CM

## 2023-02-10 ENCOUNTER — Ambulatory Visit (HOSPITAL_COMMUNITY)
Admission: RE | Admit: 2023-02-10 | Discharge: 2023-02-10 | Disposition: A | Discharge status: Home / Self Care | Payer: Medicare Other | Source: Ambulatory Visit | Attending: Gerontology | Admitting: Gerontology

## 2023-02-10 DIAGNOSIS — N189 Chronic kidney disease, unspecified: Secondary | ICD-10-CM | POA: Diagnosis not present

## 2023-02-10 DIAGNOSIS — N183 Chronic kidney disease, stage 3 unspecified: Secondary | ICD-10-CM | POA: Insufficient documentation

## 2023-02-16 ENCOUNTER — Encounter: Payer: Self-pay | Admitting: Obstetrics & Gynecology

## 2023-02-16 ENCOUNTER — Other Ambulatory Visit (HOSPITAL_COMMUNITY)
Admission: RE | Admit: 2023-02-16 | Discharge: 2023-02-16 | Disposition: A | Discharge status: Home / Self Care | Payer: Medicare Other | Source: Ambulatory Visit | Attending: Obstetrics & Gynecology | Admitting: Obstetrics & Gynecology

## 2023-02-16 ENCOUNTER — Ambulatory Visit: Payer: Medicare Other | Admitting: Obstetrics & Gynecology

## 2023-02-16 VITALS — BP 125/78 | HR 66 | Ht 67.0 in | Wt 222.0 lb

## 2023-02-16 DIAGNOSIS — N950 Postmenopausal bleeding: Secondary | ICD-10-CM | POA: Insufficient documentation

## 2023-02-16 NOTE — Progress Notes (Signed)
Endometrial Biopsy Procedure Note  Pre-operative Diagnosis: Post menopausal bleeding had a 9 mm endometrial stripe in 2022  Post-operative Diagnosis: same  Indications: postmenopausal bleeding  Procedure Details   Urine pregnancy test was not done.  The risks (including infection, bleeding, pain, and uterine perforation) and benefits of the procedure were explained to the patient and Written informed consent was obtained.  Antibiotic prophylaxis against endocarditis was not indicated.   The patient was placed in the dorsal lithotomy position.  Bimanual exam showed the uterus to be in the neutral position.  A Graves' speculum inserted in the vagina, and the cervix prepped with povidone iodine.  Endocervical curettage with a Kevorkian curette was not performed.   A sharp tenaculum was applied to the anterior lip of the cervix for stabilization.  A sterile uterine sound was used to sound the uterus to a depth of 6.5 cm.  A Pipelle endometrial aspirator was used to sample the endometrium.  Sample was sent for pathologic examination.  Condition: Stable  Complications: None  Plan:  The patient was advised to call for any fever or for prolonged or severe pain or bleeding. She was advised to use OTC analgesics as needed for mild to moderate pain. She was advised to avoid vaginal intercourse for 48 hours or until the bleeding has completely stopped.  Attending Physician Documentation: I was present for or performed the following: endometrial biopsy

## 2023-02-18 LAB — SURGICAL PATHOLOGY

## 2023-03-03 ENCOUNTER — Other Ambulatory Visit (HOSPITAL_COMMUNITY): Payer: Self-pay | Admitting: Nephrology

## 2023-03-03 DIAGNOSIS — M1990 Unspecified osteoarthritis, unspecified site: Secondary | ICD-10-CM | POA: Diagnosis not present

## 2023-03-03 DIAGNOSIS — N1832 Chronic kidney disease, stage 3b: Secondary | ICD-10-CM | POA: Diagnosis not present

## 2023-03-03 DIAGNOSIS — R011 Cardiac murmur, unspecified: Secondary | ICD-10-CM

## 2023-03-03 DIAGNOSIS — N200 Calculus of kidney: Secondary | ICD-10-CM | POA: Diagnosis not present

## 2023-03-03 DIAGNOSIS — I129 Hypertensive chronic kidney disease with stage 1 through stage 4 chronic kidney disease, or unspecified chronic kidney disease: Secondary | ICD-10-CM | POA: Diagnosis not present

## 2023-03-04 ENCOUNTER — Encounter (INDEPENDENT_AMBULATORY_CARE_PROVIDER_SITE_OTHER): Payer: Self-pay | Admitting: *Deleted

## 2023-03-04 DIAGNOSIS — M25561 Pain in right knee: Secondary | ICD-10-CM | POA: Diagnosis not present

## 2023-03-04 DIAGNOSIS — I10 Essential (primary) hypertension: Secondary | ICD-10-CM | POA: Diagnosis not present

## 2023-03-09 DIAGNOSIS — N200 Calculus of kidney: Secondary | ICD-10-CM | POA: Diagnosis not present

## 2023-03-09 DIAGNOSIS — N189 Chronic kidney disease, unspecified: Secondary | ICD-10-CM | POA: Diagnosis not present

## 2023-03-09 DIAGNOSIS — M1990 Unspecified osteoarthritis, unspecified site: Secondary | ICD-10-CM | POA: Diagnosis not present

## 2023-03-09 DIAGNOSIS — I129 Hypertensive chronic kidney disease with stage 1 through stage 4 chronic kidney disease, or unspecified chronic kidney disease: Secondary | ICD-10-CM | POA: Diagnosis not present

## 2023-03-09 DIAGNOSIS — N1832 Chronic kidney disease, stage 3b: Secondary | ICD-10-CM | POA: Diagnosis not present

## 2023-03-12 DIAGNOSIS — M25562 Pain in left knee: Secondary | ICD-10-CM | POA: Diagnosis not present

## 2023-03-12 DIAGNOSIS — R768 Other specified abnormal immunological findings in serum: Secondary | ICD-10-CM | POA: Diagnosis not present

## 2023-03-12 DIAGNOSIS — N1831 Chronic kidney disease, stage 3a: Secondary | ICD-10-CM | POA: Diagnosis not present

## 2023-03-12 DIAGNOSIS — M25561 Pain in right knee: Secondary | ICD-10-CM | POA: Diagnosis not present

## 2023-03-12 DIAGNOSIS — M79642 Pain in left hand: Secondary | ICD-10-CM | POA: Diagnosis not present

## 2023-03-12 DIAGNOSIS — M79641 Pain in right hand: Secondary | ICD-10-CM | POA: Diagnosis not present

## 2023-04-01 DIAGNOSIS — R7303 Prediabetes: Secondary | ICD-10-CM | POA: Diagnosis not present

## 2023-04-01 DIAGNOSIS — N200 Calculus of kidney: Secondary | ICD-10-CM | POA: Diagnosis not present

## 2023-04-01 DIAGNOSIS — I129 Hypertensive chronic kidney disease with stage 1 through stage 4 chronic kidney disease, or unspecified chronic kidney disease: Secondary | ICD-10-CM | POA: Diagnosis not present

## 2023-04-01 DIAGNOSIS — N1831 Chronic kidney disease, stage 3a: Secondary | ICD-10-CM | POA: Diagnosis not present

## 2023-04-02 DIAGNOSIS — R768 Other specified abnormal immunological findings in serum: Secondary | ICD-10-CM | POA: Diagnosis not present

## 2023-04-02 DIAGNOSIS — M79641 Pain in right hand: Secondary | ICD-10-CM | POA: Diagnosis not present

## 2023-04-02 DIAGNOSIS — M25561 Pain in right knee: Secondary | ICD-10-CM | POA: Diagnosis not present

## 2023-04-02 DIAGNOSIS — M79642 Pain in left hand: Secondary | ICD-10-CM | POA: Diagnosis not present

## 2023-04-02 DIAGNOSIS — M0579 Rheumatoid arthritis with rheumatoid factor of multiple sites without organ or systems involvement: Secondary | ICD-10-CM | POA: Diagnosis not present

## 2023-04-02 DIAGNOSIS — N1831 Chronic kidney disease, stage 3a: Secondary | ICD-10-CM | POA: Diagnosis not present

## 2023-04-02 DIAGNOSIS — M25562 Pain in left knee: Secondary | ICD-10-CM | POA: Diagnosis not present

## 2023-04-03 DIAGNOSIS — I10 Essential (primary) hypertension: Secondary | ICD-10-CM | POA: Diagnosis not present

## 2023-04-03 DIAGNOSIS — M25561 Pain in right knee: Secondary | ICD-10-CM | POA: Diagnosis not present

## 2023-04-15 DIAGNOSIS — Z0001 Encounter for general adult medical examination with abnormal findings: Secondary | ICD-10-CM | POA: Diagnosis not present

## 2023-04-15 DIAGNOSIS — E785 Hyperlipidemia, unspecified: Secondary | ICD-10-CM | POA: Diagnosis not present

## 2023-04-15 DIAGNOSIS — N1831 Chronic kidney disease, stage 3a: Secondary | ICD-10-CM | POA: Diagnosis not present

## 2023-04-16 ENCOUNTER — Ambulatory Visit (HOSPITAL_COMMUNITY): Admission: RE | Admit: 2023-04-16 | Payer: Medicare Other | Source: Ambulatory Visit

## 2023-04-16 DIAGNOSIS — R011 Cardiac murmur, unspecified: Secondary | ICD-10-CM | POA: Diagnosis not present

## 2023-04-16 LAB — ECHOCARDIOGRAM COMPLETE
AR max vel: 1.82 cm2
AV Area VTI: 1.78 cm2
AV Area mean vel: 1.79 cm2
AV Mean grad: 8.6 mmHg
AV Peak grad: 20.8 mmHg
Ao pk vel: 2.28 m/s
Area-P 1/2: 2.99 cm2

## 2023-04-16 NOTE — Progress Notes (Signed)
  Echocardiogram 2D Echocardiogram has been performed.  Leda Roys RDCS 04/16/2023, 2:42 PM

## 2023-04-28 DIAGNOSIS — E782 Mixed hyperlipidemia: Secondary | ICD-10-CM | POA: Diagnosis not present

## 2023-04-28 DIAGNOSIS — M1711 Unilateral primary osteoarthritis, right knee: Secondary | ICD-10-CM | POA: Diagnosis not present

## 2023-04-28 DIAGNOSIS — R760 Raised antibody titer: Secondary | ICD-10-CM | POA: Diagnosis not present

## 2023-04-28 DIAGNOSIS — N1831 Chronic kidney disease, stage 3a: Secondary | ICD-10-CM | POA: Diagnosis not present

## 2023-04-28 DIAGNOSIS — I10 Essential (primary) hypertension: Secondary | ICD-10-CM | POA: Diagnosis not present

## 2023-05-03 ENCOUNTER — Ambulatory Visit (INDEPENDENT_AMBULATORY_CARE_PROVIDER_SITE_OTHER): Payer: Medicare Other | Admitting: Gastroenterology

## 2023-05-29 DIAGNOSIS — I10 Essential (primary) hypertension: Secondary | ICD-10-CM | POA: Diagnosis not present

## 2023-05-29 DIAGNOSIS — M25561 Pain in right knee: Secondary | ICD-10-CM | POA: Diagnosis not present

## 2023-06-07 DIAGNOSIS — M109 Gout, unspecified: Secondary | ICD-10-CM | POA: Diagnosis not present

## 2023-06-07 DIAGNOSIS — M71321 Other bursal cyst, right elbow: Secondary | ICD-10-CM | POA: Diagnosis not present

## 2023-06-07 DIAGNOSIS — I10 Essential (primary) hypertension: Secondary | ICD-10-CM | POA: Diagnosis not present

## 2023-06-08 ENCOUNTER — Ambulatory Visit (HOSPITAL_COMMUNITY)
Admission: RE | Admit: 2023-06-08 | Discharge: 2023-06-08 | Disposition: A | Discharge status: Home / Self Care | Payer: Medicare Other | Source: Ambulatory Visit | Attending: Gerontology | Admitting: Gerontology

## 2023-06-08 ENCOUNTER — Other Ambulatory Visit (HOSPITAL_COMMUNITY): Payer: Self-pay | Admitting: Gerontology

## 2023-06-08 DIAGNOSIS — M7989 Other specified soft tissue disorders: Secondary | ICD-10-CM | POA: Diagnosis not present

## 2023-06-08 DIAGNOSIS — M779 Enthesopathy, unspecified: Secondary | ICD-10-CM | POA: Diagnosis not present

## 2023-06-08 DIAGNOSIS — M71321 Other bursal cyst, right elbow: Secondary | ICD-10-CM

## 2023-06-22 ENCOUNTER — Telehealth: Payer: Self-pay | Admitting: Orthopaedic Surgery

## 2023-06-22 ENCOUNTER — Ambulatory Visit: Payer: Medicare Other | Admitting: Orthopaedic Surgery

## 2023-06-22 ENCOUNTER — Encounter: Payer: Self-pay | Admitting: Orthopaedic Surgery

## 2023-06-22 VITALS — BP 149/95 | HR 80 | Ht 67.0 in | Wt 230.0 lb

## 2023-06-22 DIAGNOSIS — M069 Rheumatoid arthritis, unspecified: Secondary | ICD-10-CM | POA: Insufficient documentation

## 2023-06-22 DIAGNOSIS — M7021 Olecranon bursitis, right elbow: Secondary | ICD-10-CM

## 2023-06-22 NOTE — Progress Notes (Signed)
Subjective:    Patient ID: Melinda Murphy, female    DOB: 1951/04/16, 72 y.o.   MRN: [id number]  HPI She fell about three weeks ago and  hit her right elbow. She has developed swelling of the olecranon bursa area that will not go away.  She has no redness,no pain.  She is concerned it is swollen.  She wants to know what to do.  She has no paresthesias.   Review of Systems  Constitutional:  Positive for activity change.  All other systems reviewed and are negative. For Review of Systems, all other systems reviewed and are negative.  The following is a summary of the past history medically, past history surgically, known current medicines, social history and family history.  This information is gathered electronically by the computer from prior information and documentation.  I review this each visit and have found including this information at this point in the chart is beneficial and informative.   Past Medical History:  Diagnosis Date   Hypertension     History reviewed. No pertinent surgical history.  Current Outpatient Medications on File Prior to Visit  Medication Sig Dispense Refill   amLODipine (NORVASC) 10 MG tablet Take 10 mg by mouth daily.     hydroxychloroquine (PLAQUENIL) 200 MG tablet 1 tab Orally twice a day for 30 days     allopurinol (ZYLOPRIM) 100 MG tablet Take 100 mg by mouth daily. (Patient not taking: Reported on 06/22/2023)     losartan (COZAAR) 50 MG tablet Take 50 mg by mouth daily. (Patient not taking: Reported on 06/22/2023)     naproxen (NAPROSYN) 500 MG tablet Take 1 tablet (500 mg total) by mouth 2 (two) times daily with a meal. (Patient not taking: Reported on 06/22/2023) 30 tablet 0   simvastatin (ZOCOR) 20 MG tablet Take 20 mg by mouth daily. (Patient not taking: Reported on 02/16/2023)     traMADol (ULTRAM) 50 MG tablet Take 1 tablet (50 mg total) by mouth 2 (two) times daily as needed. (Patient not taking: Reported on 06/22/2023) 20 tablet 2   No current  facility-administered medications on file prior to visit.    Social History   Socioeconomic History   Marital status: Single    Spouse name: Not on file   Number of children: Not on file   Years of education: Not on file   Highest education level: Not on file  Occupational History   Not on file  Tobacco Use   Smoking status: Never   Smokeless tobacco: Never  Vaping Use   Vaping status: Never Used  Substance and Sexual Activity   Alcohol use: Never   Drug use: Never   Sexual activity: Not Currently    Birth control/protection: Abstinence  Other Topics Concern   Not on file  Social History Narrative   Not on file   Social Determinants of Health   Financial Resource Strain: Medium Risk (03/21/2021)   Overall Financial Resource Strain (CARDIA)    Difficulty of Paying Living Expenses: Somewhat hard  Food Insecurity: No Food Insecurity (07/09/2022)   Hunger Vital Sign    Worried About Running Out of Food in the Last Year: Never true    Ran Out of Food in the Last Year: Never true  Transportation Needs: No Transportation Needs (07/09/2022)   PRAPARE - Administrator, Civil Service (Medical): No    Lack of Transportation (Non-Medical): No  Physical Activity: Sufficiently Active (03/21/2021)   Exercise Vital Sign  Days of Exercise per Week: 5 days    Minutes of Exercise per Session: 30 min  Stress: No Stress Concern Present (03/21/2021)   [employer] of Occupational Health - Occupational Stress Questionnaire    Feeling of Stress : Not at all  Social Connections: Moderately Integrated (03/21/2021)   Social Connection and Isolation Panel [NHANES]    Frequency of Communication with Friends and Family: More than three times a week    Frequency of Social Gatherings with Friends and Family: More than three times a week    Attends Religious Services: More than 4 times per year    Active Member of [employer] or Organizations: Yes    Attends Banker Meetings:  More than 4 times per year    Marital Status: Never married  Intimate Partner Violence: Not At Risk (03/21/2021)   Humiliation, Afraid, Rape, and Kick questionnaire    Fear of Current or Ex-Partner: No    Emotionally Abused: No    Physically Abused: No    Sexually Abused: No    Family History  Problem Relation Age of Onset   Aneurysm Mother     BP (!) 149/95   Pulse 80   Ht 5\' 7"  (1.702 m)   Wt 230 lb (104.3 kg)   BMI 36.02 kg/m   Body mass index is 36.02 kg/m.      Objective:   Physical Exam Vitals and nursing note reviewed. Exam conducted with a chaperone present.  Constitutional:      Appearance: She is well-developed.  HENT:     Head: Normocephalic and atraumatic.  Eyes:     Conjunctiva/sclera: Conjunctivae normal.     Pupils: Pupils are equal, round, and reactive to light.  Cardiovascular:     Rate and Rhythm: Normal rate and regular rhythm.  Pulmonary:     Effort: Pulmonary effort is normal.  Abdominal:     Palpations: Abdomen is soft.  Musculoskeletal:       Arms:     Cervical back: Normal range of motion and neck supple.  Skin:    General: Skin is warm and dry.  Neurological:     Mental Status: She is alert and oriented to person, place, and time.     Cranial Nerves: No cranial nerve deficit.     Motor: No abnormal muscle tone.     Coordination: Coordination normal.     Deep Tendon Reflexes: Reflexes are normal and symmetric. Reflexes normal.  Psychiatric:        Behavior: Behavior normal.        Thought Content: Thought content normal.        Judgment: Judgment normal.           Assessment & Plan:   Encounter Diagnosis  Name Primary?   Olecranon bursitis, right elbow Yes   I have told her she can just watch it, I can aspirate it or she can consider surgical excision if the swelling returns after aspiration.  She says she will just watch it for now.  It does not hurt.  I will see her prn.  Call if any problem.  Precautions  discussed.  Electronically Signed Darreld Mclean, MD 10/1/202410:22 AM

## 2023-06-22 NOTE — Telephone Encounter (Signed)
Dr. Sanjuan Dame pt - pt lvm stating that her vitals and her weight are wrong on there AVS, she wants a call back regarding this.  Her message said numbers are real important to her.

## 2023-06-23 NOTE — Telephone Encounter (Signed)
Called patient and sending new AVS with the 3rd BP check and the weight that the patient stated she was @ 210lb

## 2023-07-07 DIAGNOSIS — I10 Essential (primary) hypertension: Secondary | ICD-10-CM | POA: Diagnosis not present

## 2023-07-07 DIAGNOSIS — M25561 Pain in right knee: Secondary | ICD-10-CM | POA: Diagnosis not present

## 2023-07-14 DIAGNOSIS — E119 Type 2 diabetes mellitus without complications: Secondary | ICD-10-CM | POA: Diagnosis not present

## 2023-07-14 DIAGNOSIS — N1831 Chronic kidney disease, stage 3a: Secondary | ICD-10-CM | POA: Diagnosis not present

## 2023-07-14 DIAGNOSIS — I129 Hypertensive chronic kidney disease with stage 1 through stage 4 chronic kidney disease, or unspecified chronic kidney disease: Secondary | ICD-10-CM | POA: Diagnosis not present

## 2023-07-14 DIAGNOSIS — I10 Essential (primary) hypertension: Secondary | ICD-10-CM | POA: Diagnosis not present

## 2023-07-23 DIAGNOSIS — N200 Calculus of kidney: Secondary | ICD-10-CM | POA: Diagnosis not present

## 2023-07-23 DIAGNOSIS — I340 Nonrheumatic mitral (valve) insufficiency: Secondary | ICD-10-CM | POA: Diagnosis not present

## 2023-07-23 DIAGNOSIS — I129 Hypertensive chronic kidney disease with stage 1 through stage 4 chronic kidney disease, or unspecified chronic kidney disease: Secondary | ICD-10-CM | POA: Diagnosis not present

## 2023-07-23 DIAGNOSIS — R7303 Prediabetes: Secondary | ICD-10-CM | POA: Diagnosis not present

## 2023-08-07 DIAGNOSIS — M25561 Pain in right knee: Secondary | ICD-10-CM | POA: Diagnosis not present

## 2023-08-07 DIAGNOSIS — I10 Essential (primary) hypertension: Secondary | ICD-10-CM | POA: Diagnosis not present

## 2023-09-06 DIAGNOSIS — I10 Essential (primary) hypertension: Secondary | ICD-10-CM | POA: Diagnosis not present

## 2023-09-06 DIAGNOSIS — M25561 Pain in right knee: Secondary | ICD-10-CM | POA: Diagnosis not present

## 2023-10-07 DIAGNOSIS — M25561 Pain in right knee: Secondary | ICD-10-CM | POA: Diagnosis not present

## 2023-10-07 DIAGNOSIS — I10 Essential (primary) hypertension: Secondary | ICD-10-CM | POA: Diagnosis not present

## 2023-10-18 ENCOUNTER — Ambulatory Visit: Payer: Medicare Other | Attending: Internal Medicine | Admitting: Internal Medicine

## 2023-10-18 ENCOUNTER — Encounter: Payer: Self-pay | Admitting: Internal Medicine

## 2023-10-18 VITALS — BP 118/88 | HR 91 | Ht 67.0 in | Wt 216.0 lb

## 2023-10-18 DIAGNOSIS — I10 Essential (primary) hypertension: Secondary | ICD-10-CM | POA: Diagnosis not present

## 2023-10-18 DIAGNOSIS — I340 Nonrheumatic mitral (valve) insufficiency: Secondary | ICD-10-CM | POA: Insufficient documentation

## 2023-10-18 DIAGNOSIS — R002 Palpitations: Secondary | ICD-10-CM | POA: Insufficient documentation

## 2023-10-18 NOTE — Patient Instructions (Signed)
Medication Instructions:  Your physician recommends that you continue on your current medications as directed. Please refer to the Current Medication list given to you today.   Labwork: None today  Testing/Procedures: None today  Follow-Up: As needed  Any Other Special Instructions Will Be Listed Below (If Applicable).  If you need a refill on your cardiac medications before your next appointment, please call your pharmacy.

## 2023-10-18 NOTE — Progress Notes (Signed)
Cardiology Office Note  Date: 10/18/2023   ID: [name], [name] 1951-02-24, MRN [id number]  PCP:  Benetta Spar, MD  Cardiologist:  None Electrophysiologist:  None   History of Present Illness: Melinda Murphy is a 73 y.o. female known to have HTN, CKD was referred to cardiology clinic for evaluation of mitral regurgitation on echocardiogram.  Patient underwent echocardiogram in 2024 that showed normal LVEF, indeterminate diastology with elevated left atrial pressure, CVP 3 mmHg, mild MR and mild TR.  She does not report any symptoms of DOE, orthopnea, PND, leg swelling, angina, dizziness, syncope in the last 1 year.  She did endorse palpitations when she was taking triamterene-HCTZ but these resolved after stopping the medication.  Currently on amlodipine and losartan.  Denies having any symptoms.  Past Medical History:  Diagnosis Date   Hypertension     No past surgical history on file.  Current Outpatient Medications  Medication Sig Dispense Refill   amLODipine (NORVASC) 10 MG tablet Take 10 mg by mouth daily.     losartan (COZAAR) 50 MG tablet Take 50 mg by mouth daily.     No current facility-administered medications for this visit.   Allergies:  Patient has no known allergies.   Social History: The patient  reports that she has never smoked. She has never used smokeless tobacco. She reports that she does not drink alcohol and does not use drugs.   Family History: The patient's family history includes Aneurysm in her mother.   ROS:  Please see the history of present illness. Otherwise, complete review of systems is positive for none.  All other systems are reviewed and negative.   Physical Exam: VS:  BP 118/88   Pulse 91   Ht 5\' 7"  (1.702 m)   Wt 216 lb (98 kg)   SpO2 98%   BMI 33.83 kg/m , BMI Body mass index is 33.83 kg/m.  Wt Readings from Last 3 Encounters:  10/18/23 216 lb (98 kg)  06/23/23 210 lb (95.3 kg)  02/16/23 222 lb (100.7 kg)     General: Patient appears comfortable at rest. HEENT: Conjunctiva and lids normal, oropharynx clear with moist mucosa. Neck: Supple, no elevated JVP or carotid bruits, no thyromegaly. Lungs: Clear to auscultation, nonlabored breathing at rest. Cardiac: Regular rate and rhythm, no S3 or significant systolic murmur, no pericardial rub. Abdomen: Soft, nontender, no hepatomegaly, bowel sounds present, no guarding or rebound. Extremities: No pitting edema, distal pulses 2+. Skin: Warm and dry. Musculoskeletal: No kyphosis. Neuropsychiatric: Alert and oriented x3, affect grossly appropriate.  Recent Labwork: No results found for requested labs within last 365 days.  No results found for: "CHOL", "TRIG", "HDL", "CHOLHDL", "VLDL", "LDLCALC", "LDLDIRECT"   Assessment and Plan:  Mild MR and mild TR on the echocardiogram: Asymptomatic, no angina or DOE.  Guidelines recommend repeat echocardiogram every 3 to 5 years however can be deferred if no symptoms.  Patient educated to return back to cardiology clinic if she develops any new symptoms of angina or DOE in the future.  She verbalized understanding.  Palpitations: She had palpitations while on triamterene-HCTZ in the past.  These palpitations resolved after discontinuing this medication.  Currently on amlodipine 10 mg once daily and losartan 50 mg once daily and she does not have any palpitations.  No further workup.  HTN, controlled: Continue amlodipine 10 mg once daily and losartan 50 mg once daily.  Follows with PCP.    Medication Adjustments/Labs and Tests Ordered: Current medicines  are reviewed at length with the patient today.  Concerns regarding medicines are outlined above.    Disposition:  Follow up   Signed, Norwin Aleman Verne Spurr, MD, 10/18/2023 3:03 PM     [facility] [address], [place], [place] [address]

## 2023-10-20 DIAGNOSIS — I340 Nonrheumatic mitral (valve) insufficiency: Secondary | ICD-10-CM | POA: Diagnosis not present

## 2023-10-20 DIAGNOSIS — N200 Calculus of kidney: Secondary | ICD-10-CM | POA: Diagnosis not present

## 2023-10-20 DIAGNOSIS — E785 Hyperlipidemia, unspecified: Secondary | ICD-10-CM | POA: Diagnosis not present

## 2023-10-20 DIAGNOSIS — I10 Essential (primary) hypertension: Secondary | ICD-10-CM | POA: Diagnosis not present

## 2023-10-20 DIAGNOSIS — N182 Chronic kidney disease, stage 2 (mild): Secondary | ICD-10-CM | POA: Diagnosis not present

## 2023-10-20 DIAGNOSIS — M109 Gout, unspecified: Secondary | ICD-10-CM | POA: Diagnosis not present

## 2023-10-20 DIAGNOSIS — N1831 Chronic kidney disease, stage 3a: Secondary | ICD-10-CM | POA: Diagnosis not present

## 2023-10-20 DIAGNOSIS — R7303 Prediabetes: Secondary | ICD-10-CM | POA: Diagnosis not present

## 2023-10-20 DIAGNOSIS — I129 Hypertensive chronic kidney disease with stage 1 through stage 4 chronic kidney disease, or unspecified chronic kidney disease: Secondary | ICD-10-CM | POA: Diagnosis not present

## 2023-10-21 ENCOUNTER — Ambulatory Visit: Payer: Medicare Other | Admitting: Internal Medicine

## 2023-10-21 DIAGNOSIS — M109 Gout, unspecified: Secondary | ICD-10-CM | POA: Diagnosis not present

## 2023-10-21 DIAGNOSIS — N1831 Chronic kidney disease, stage 3a: Secondary | ICD-10-CM | POA: Diagnosis not present

## 2023-10-21 DIAGNOSIS — I10 Essential (primary) hypertension: Secondary | ICD-10-CM | POA: Diagnosis not present

## 2023-10-21 DIAGNOSIS — E785 Hyperlipidemia, unspecified: Secondary | ICD-10-CM | POA: Diagnosis not present

## 2023-10-21 DIAGNOSIS — R7303 Prediabetes: Secondary | ICD-10-CM | POA: Diagnosis not present

## 2023-10-28 DIAGNOSIS — H00012 Hordeolum externum right lower eyelid: Secondary | ICD-10-CM | POA: Diagnosis not present

## 2023-11-03 DIAGNOSIS — I129 Hypertensive chronic kidney disease with stage 1 through stage 4 chronic kidney disease, or unspecified chronic kidney disease: Secondary | ICD-10-CM | POA: Diagnosis not present

## 2023-11-03 DIAGNOSIS — N1831 Chronic kidney disease, stage 3a: Secondary | ICD-10-CM | POA: Diagnosis not present

## 2023-11-03 DIAGNOSIS — N200 Calculus of kidney: Secondary | ICD-10-CM | POA: Diagnosis not present

## 2023-11-03 DIAGNOSIS — I340 Nonrheumatic mitral (valve) insufficiency: Secondary | ICD-10-CM | POA: Diagnosis not present

## 2023-11-04 DIAGNOSIS — E782 Mixed hyperlipidemia: Secondary | ICD-10-CM | POA: Diagnosis not present

## 2023-11-04 DIAGNOSIS — I10 Essential (primary) hypertension: Secondary | ICD-10-CM | POA: Diagnosis not present

## 2023-11-04 DIAGNOSIS — N1831 Chronic kidney disease, stage 3a: Secondary | ICD-10-CM | POA: Diagnosis not present

## 2023-11-04 DIAGNOSIS — M1711 Unilateral primary osteoarthritis, right knee: Secondary | ICD-10-CM | POA: Diagnosis not present

## 2023-11-15 ENCOUNTER — Other Ambulatory Visit (HOSPITAL_COMMUNITY): Payer: Self-pay | Admitting: Nephrology

## 2023-11-15 DIAGNOSIS — N1832 Chronic kidney disease, stage 3b: Secondary | ICD-10-CM

## 2023-11-15 DIAGNOSIS — I129 Hypertensive chronic kidney disease with stage 1 through stage 4 chronic kidney disease, or unspecified chronic kidney disease: Secondary | ICD-10-CM

## 2023-11-22 ENCOUNTER — Encounter (HOSPITAL_COMMUNITY): Payer: Self-pay

## 2023-11-22 ENCOUNTER — Other Ambulatory Visit (HOSPITAL_COMMUNITY): Payer: Medicare Other

## 2023-12-02 DIAGNOSIS — M25561 Pain in right knee: Secondary | ICD-10-CM | POA: Diagnosis not present

## 2023-12-02 DIAGNOSIS — I10 Essential (primary) hypertension: Secondary | ICD-10-CM | POA: Diagnosis not present

## 2024-01-02 DIAGNOSIS — I10 Essential (primary) hypertension: Secondary | ICD-10-CM | POA: Diagnosis not present

## 2024-01-02 DIAGNOSIS — M25561 Pain in right knee: Secondary | ICD-10-CM | POA: Diagnosis not present

## 2024-01-19 ENCOUNTER — Ambulatory Visit: Admitting: Orthopaedic Surgery

## 2024-01-19 ENCOUNTER — Encounter: Payer: Self-pay | Admitting: Orthopaedic Surgery

## 2024-01-19 VITALS — BP 138/80 | HR 80 | Ht 67.0 in | Wt 210.0 lb

## 2024-01-19 DIAGNOSIS — G5602 Carpal tunnel syndrome, left upper limb: Secondary | ICD-10-CM | POA: Diagnosis not present

## 2024-01-19 NOTE — Progress Notes (Signed)
 I have hand numbness  She has had numbness in the left hand getting worse over the last several months.  It happens more at night.  She has no trauma.  She has no swelling.    Left hand has positive Phalen sign and negative Tinel sign.  Decreased sensation in left median nerve is present.  ROM is full.  Encounter Diagnosis  Name Primary?   Carpal tunnel syndrome, left upper limb Yes   I will set her up for EMGs.  She declines a splint.  I have explained what Carpal Tunnel is.  Call if any problem.  Precautions discussed.  Return in one month post study.  Electronically Signed Pleasant Brilliant, MD 4/30/20259:31 AM

## 2024-01-28 ENCOUNTER — Telehealth: Payer: Self-pay | Admitting: Internal Medicine

## 2024-01-28 NOTE — Telephone Encounter (Signed)
 Patient stated she will need Dr. Mallipeddi to complete a form verifying she has a mitral valve condition to qualify for her insurance program.  Patient stated she will be dropping off the form on Monday morning (5/12) for Dr. Mallipeddi to complete.

## 2024-01-28 NOTE — Telephone Encounter (Signed)
 noted

## 2024-01-31 NOTE — Telephone Encounter (Signed)
 No paperwork was left with office staff today.

## 2024-02-01 DIAGNOSIS — M25561 Pain in right knee: Secondary | ICD-10-CM | POA: Diagnosis not present

## 2024-02-01 DIAGNOSIS — I10 Essential (primary) hypertension: Secondary | ICD-10-CM | POA: Diagnosis not present

## 2024-02-02 ENCOUNTER — Ambulatory Visit: Admitting: Physical Medicine and Rehabilitation

## 2024-02-02 DIAGNOSIS — R202 Paresthesia of skin: Secondary | ICD-10-CM

## 2024-02-02 DIAGNOSIS — M79642 Pain in left hand: Secondary | ICD-10-CM | POA: Diagnosis not present

## 2024-02-02 DIAGNOSIS — R29898 Other symptoms and signs involving the musculoskeletal system: Secondary | ICD-10-CM

## 2024-02-02 DIAGNOSIS — M542 Cervicalgia: Secondary | ICD-10-CM | POA: Diagnosis not present

## 2024-02-02 NOTE — Progress Notes (Signed)
 Pain Scale   Average Pain 5 Patient advised her numbness and tingling and it has been constant with out relief.. Patient advises she is Left Hand Dominate..        +Driver, -BT, -Dye Allergies.

## 2024-02-08 ENCOUNTER — Encounter: Admitting: Physical Medicine and Rehabilitation

## 2024-02-09 NOTE — Procedures (Signed)
 EMG & NCV Findings: Evaluation of the left median motor nerve showed prolonged distal onset latency (7.5 ms), reduced amplitude (0.3 mV), and decreased conduction velocity (Elbow-Wrist, 44 m/s).  The left median (across palm) sensory nerve showed prolonged distal peak latency (Wrist, 11.9 ms), reduced amplitude (9.4 V), and prolonged distal peak latency (Palm, 6.0 ms).  All remaining nerves (as indicated in the following tables) were within normal limits.    Needle evaluation of the left abductor pollicis brevis muscle showed increased insertional activity and slightly increased spontaneous activity.  All remaining muscles (as indicated in the following table) showed no evidence of electrical instability.    Impression: The above electrodiagnostic study is ABNORMAL and reveals evidence of a severe left median nerve entrapment at the wrist (carpal tunnel syndrome) affecting sensory and motor components.   There is no significant electrodiagnostic evidence of any other focal nerve entrapment, brachial plexopathy or cervical radiculopathy.   Recommendations: 1.  Follow-up with referring physician. 2.  Continue current management of symptoms. 3.  Suggest surgical evaluation.  ___________________________ Collin Deal FAAPMR Board Certified, American Board of Physical Medicine and Rehabilitation    Nerve Conduction Studies Anti Sensory Summary Table   Stim Site NR Peak (ms) Norm Peak (ms) P-T Amp (V) Norm P-T Amp Site1 Site2 Delta-P (ms) Dist (cm) Vel (m/s) Norm Vel (m/s)  Left Median Acr Palm Anti Sensory (2nd Digit)  32C  Wrist    *11.9 <3.6 *9.4 >10 Wrist Palm 5.9 0.0    Palm    *6.0 <2.0 0.1         Left Radial Anti Sensory (Base 1st Digit)  31.7C  Wrist    2.1 <3.1 17.2  Wrist Base 1st Digit 2.1 0.0    Left Ulnar Anti Sensory (5th Digit)  32.5C  Wrist    3.2 <3.7 28.0 >15.0 Wrist 5th Digit 3.2 14.0 44 >38   Motor Summary Table   Stim Site NR Onset (ms) Norm Onset (ms) O-P Amp  (mV) Norm O-P Amp Site1 Site2 Delta-0 (ms) Dist (cm) Vel (m/s) Norm Vel (m/s)  Left Median Motor (Abd Poll Brev)  32.3C  Wrist    *7.5 <4.2 *0.3 >5 Elbow Wrist 5.2 23.0 *44 >50  Elbow    12.7  0.2         Left Ulnar Motor (Abd Dig Min)  32.6C  Wrist    2.7 <4.2 9.4 >3 B Elbow Wrist 3.5 21.0 60 >53  B Elbow    6.2  6.1  A Elbow B Elbow 1.5 10.0 67 >53  A Elbow    7.7  3.0          EMG   Side Muscle Nerve Root Ins Act Fibs Psw Amp Dur Poly Recrt Int Deatra Face Comment  Left Abd Poll Brev Median C8-T1 *Incr *1+ *1+ Nml Nml 0 Nml Nml   Left 1stDorInt Ulnar C8-T1 Nml Nml Nml Nml Nml 0 Nml Nml   Left PronatorTeres Median C6-7 Nml Nml Nml Nml Nml 0 Nml Nml   Left Biceps Musculocut C5-6 Nml Nml Nml Nml Nml 0 Nml Nml     Nerve Conduction Studies Anti Sensory Left/Right Comparison   Stim Site L Lat (ms) R Lat (ms) L-R Lat (ms) L Amp (V) R Amp (V) L-R Amp (%) Site1 Site2 L Vel (m/s) R Vel (m/s) L-R Vel (m/s)  Median Acr Palm Anti Sensory (2nd Digit)  32C  Wrist *11.9   *9.4   Wrist Palm     Palm *  6.0   0.1         Radial Anti Sensory (Base 1st Digit)  31.7C  Wrist 2.1   17.2   Wrist Base 1st Digit     Ulnar Anti Sensory (5th Digit)  32.5C  Wrist 3.2   28.0   Wrist 5th Digit 44     Motor Left/Right Comparison   Stim Site L Lat (ms) R Lat (ms) L-R Lat (ms) L Amp (mV) R Amp (mV) L-R Amp (%) Site1 Site2 L Vel (m/s) R Vel (m/s) L-R Vel (m/s)  Median Motor (Abd Poll Brev)  32.3C  Wrist *7.5   *0.3   Elbow Wrist *44    Elbow 12.7   0.2         Ulnar Motor (Abd Dig Min)  32.6C  Wrist 2.7   9.4   B Elbow Wrist 60    B Elbow 6.2   6.1   A Elbow B Elbow 67    A Elbow 7.7   3.0            Waveforms:

## 2024-02-10 ENCOUNTER — Encounter: Payer: Self-pay | Admitting: Physical Medicine and Rehabilitation

## 2024-02-10 NOTE — Progress Notes (Signed)
 Melinda Murphy - 73 y.o. female MRN [id number]  Date of birth: 1951-05-05  Office Visit Note: Visit Date: 02/02/2024 PCP: Wyvonna Heidelberg, MD Referred by: Pleasant Brilliant, MD  Subjective: Chief Complaint  Patient presents with   Left Hand - Pain, Numbness   HPI: Melinda Murphy is a 73 y.o. female who comes in today at the request of Dr. Pleasant Brilliant for evaluation and management of chronic, worsening and severe pain, numbness and tingling in the Left upper extremities.  Patient is Left hand dominant.  Chronic long-term complaints of numbness and tingling in the left hand but now with fairly constant numbness during the day.  Symptoms are mostly global but can be more radial sided.  She does get nocturnal complaints.  She has had weakness in the hand as well with dropping objects and trouble manipulating things.  She denies any right-sided symptoms.  She has history of rheumatoid arthritis but no history of diabetes or thyroid disease.  She does get some neck pain but no frank radicular symptoms down the arms.  No prior electrodiagnostic studies.  She has tried medication management without much relief.  She has not wanted to try splints.   I spent more than 30 minutes speaking face-to-face with the patient with 50% of the time in counseling and discussing coordination of care.      Review of Systems  Musculoskeletal:  Positive for joint pain.  Neurological:  Positive for tingling and focal weakness.  All other systems reviewed and are negative.  Otherwise per HPI.  Assessment & Plan: Visit Diagnoses:    ICD-10-CM   1. Paresthesia of skin  R20.2 NCV with EMG (electromyography)    2. Pain in left hand  M79.642     3. Hand weakness  R29.898     4. 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  Electrodiagnostic study performed.  The above electrodiagnostic study is  ABNORMAL and reveals evidence of a severe left median nerve entrapment at the wrist (carpal tunnel syndrome) affecting sensory and motor components.   There is no significant electrodiagnostic evidence of any other focal nerve entrapment, brachial plexopathy or cervical radiculopathy.   Recommendations: 1.  Follow-up with referring physician. 2.  Continue current management of symptoms. 3.  Suggest surgical evaluation.  Meds & Orders: No orders of the defined types were placed in this encounter.   Orders Placed This Encounter  Procedures   NCV with EMG (electromyography)    Follow-up: Return for Pleasant Brilliant, MD.   Procedures: No procedures performed  EMG & NCV Findings: Evaluation of the left median motor nerve showed prolonged distal onset latency (7.5 ms), reduced amplitude (0.3 mV), and decreased conduction velocity (Elbow-Wrist, 44 m/s).  The left median (across palm) sensory nerve showed prolonged distal peak latency (Wrist, 11.9 ms), reduced amplitude (9.4 V), and prolonged distal peak latency (Palm, 6.0 ms).  All remaining nerves (as indicated in the following tables) were within normal limits.    Needle evaluation of the left abductor pollicis brevis muscle showed increased insertional activity and slightly increased spontaneous activity.  All remaining muscles (as indicated in the following table) showed no evidence of electrical instability.    Impression: The above electrodiagnostic study is ABNORMAL and reveals evidence of a severe left median nerve entrapment at the wrist (carpal tunnel syndrome) affecting sensory and motor components.   There is no  significant electrodiagnostic evidence of any other focal nerve entrapment, brachial plexopathy or cervical radiculopathy.   Recommendations: 1.  Follow-up with referring physician. 2.  Continue current management of symptoms. 3.  Suggest surgical evaluation.  ___________________________ Collin Deal FAAPMR Board  Certified, American Board of Physical Medicine and Rehabilitation    Nerve Conduction Studies Anti Sensory Summary Table   Stim Site NR Peak (ms) Norm Peak (ms) P-T Amp (V) Norm P-T Amp Site1 Site2 Delta-P (ms) Dist (cm) Vel (m/s) Norm Vel (m/s)  Left Median Acr Palm Anti Sensory (2nd Digit)  32C  Wrist    *11.9 <3.6 *9.4 >10 Wrist Palm 5.9 0.0    Palm    *6.0 <2.0 0.1         Left Radial Anti Sensory (Base 1st Digit)  31.7C  Wrist    2.1 <3.1 17.2  Wrist Base 1st Digit 2.1 0.0    Left Ulnar Anti Sensory (5th Digit)  32.5C  Wrist    3.2 <3.7 28.0 >15.0 Wrist 5th Digit 3.2 14.0 44 >38   Motor Summary Table   Stim Site NR Onset (ms) Norm Onset (ms) O-P Amp (mV) Norm O-P Amp Site1 Site2 Delta-0 (ms) Dist (cm) Vel (m/s) Norm Vel (m/s)  Left Median Motor (Abd Poll Brev)  32.3C  Wrist    *7.5 <4.2 *0.3 >5 Elbow Wrist 5.2 23.0 *44 >50  Elbow    12.7  0.2         Left Ulnar Motor (Abd Dig Min)  32.6C  Wrist    2.7 <4.2 9.4 >3 B Elbow Wrist 3.5 21.0 60 >53  B Elbow    6.2  6.1  A Elbow B Elbow 1.5 10.0 67 >53  A Elbow    7.7  3.0          EMG   Side Muscle Nerve Root Ins Act Fibs Psw Amp Dur Poly Recrt Int Deatra Face Comment  Left Abd Poll Brev Median C8-T1 *Incr *1+ *1+ Nml Nml 0 Nml Nml   Left 1stDorInt Ulnar C8-T1 Nml Nml Nml Nml Nml 0 Nml Nml   Left PronatorTeres Median C6-7 Nml Nml Nml Nml Nml 0 Nml Nml   Left Biceps Musculocut C5-6 Nml Nml Nml Nml Nml 0 Nml Nml     Nerve Conduction Studies Anti Sensory Left/Right Comparison   Stim Site L Lat (ms) R Lat (ms) L-R Lat (ms) L Amp (V) R Amp (V) L-R Amp (%) Site1 Site2 L Vel (m/s) R Vel (m/s) L-R Vel (m/s)  Median Acr Palm Anti Sensory (2nd Digit)  32C  Wrist *11.9   *9.4   Wrist Palm     Palm *6.0   0.1         Radial Anti Sensory (Base 1st Digit)  31.7C  Wrist 2.1   17.2   Wrist Base 1st Digit     Ulnar Anti Sensory (5th Digit)  32.5C  Wrist 3.2   28.0   Wrist 5th Digit 44     Motor Left/Right Comparison   Stim Site L  Lat (ms) R Lat (ms) L-R Lat (ms) L Amp (mV) R Amp (mV) L-R Amp (%) Site1 Site2 L Vel (m/s) R Vel (m/s) L-R Vel (m/s)  Median Motor (Abd Poll Brev)  32.3C  Wrist *7.5   *0.3   Elbow Wrist *44    Elbow 12.7   0.2         Ulnar Motor (Abd Dig Min)  32.6C  Wrist 2.7   9.4  B Elbow Wrist 60    B Elbow 6.2   6.1   A Elbow B Elbow 67    A Elbow 7.7   3.0            Waveforms:            Clinical History: No specialty comments available.   She reports that she has never smoked. She has never used smokeless tobacco. No results for input(s): "HGBA1C", "LABURIC" in the last 8760 hours.  Objective:  VS:  HT:    WT:   BMI:     BP:   HR: bpm  TEMP: ( )  RESP:  Physical Exam Vitals and nursing note reviewed.  Constitutional:      General: She is not in acute distress.    Appearance: Normal appearance. She is well-developed. She is not ill-appearing.  HENT:     Head: Normocephalic and atraumatic.  Eyes:     Conjunctiva/sclera: Conjunctivae normal.     Pupils: Pupils are equal, round, and reactive to light.  Cardiovascular:     Rate and Rhythm: Normal rate.     Pulses: Normal pulses.  Pulmonary:     Effort: Pulmonary effort is normal.  Musculoskeletal:        General: No swelling, tenderness or deformity.     Right lower leg: No edema.     Left lower leg: No edema.     Comments: Inspection reveals no atrophy of the bilateral APB or FDI or hand intrinsics. There is no swelling, color changes, allodynia or dystrophic changes. There is 5 out of 5 strength in the bilateral wrist extension, finger abduction and long finger flexion. There is intact sensation to light touch in all dermatomal and peripheral nerve distributions. There is a negative Tinel's test at the bilateral wrist and elbow. There is a positive Phalen's test on the left. There is a negative Hoffmann's test bilaterally.  Skin:    General: Skin is warm and dry.     Findings: No erythema or rash.  Neurological:      General: No focal deficit present.     Mental Status: She is alert and oriented to person, place, and time.     Cranial Nerves: No cranial nerve deficit.     Sensory: Sensory deficit present.     Motor: No weakness or abnormal muscle tone.     Coordination: Coordination normal.     Gait: Gait normal.  Psychiatric:        Mood and Affect: Mood normal.        Behavior: Behavior normal.     Ortho Exam  Imaging: No results found.  Past Medical/Family/Surgical/Social History: Medications & Allergies reviewed per EMR, new medications updated. Patient Active Problem List   Diagnosis Date Noted   Mild mitral regurgitation 10/18/2023   Palpitations 10/18/2023   HTN (hypertension) 10/18/2023   Rheumatoid arthritis (HCC) 06/22/2023   Past Medical History:  Diagnosis Date   Hypertension    Family History  Problem Relation Age of Onset   Aneurysm Mother    History reviewed. No pertinent surgical history. Social History   Occupational History   Not on file  Tobacco Use   Smoking status: Never   Smokeless tobacco: Never  Vaping Use   Vaping status: Never Used  Substance and Sexual Activity   Alcohol use: Never   Drug use: Never   Sexual activity: Not Currently    Birth control/protection: Abstinence

## 2024-02-23 ENCOUNTER — Ambulatory Visit: Admitting: Orthopaedic Surgery

## 2024-02-23 ENCOUNTER — Encounter: Payer: Self-pay | Admitting: Orthopaedic Surgery

## 2024-02-23 VITALS — BP 172/98

## 2024-02-23 DIAGNOSIS — G5602 Carpal tunnel syndrome, left upper limb: Secondary | ICD-10-CM | POA: Diagnosis not present

## 2024-02-23 NOTE — Progress Notes (Signed)
 Emg review

## 2024-02-23 NOTE — Progress Notes (Signed)
 My hand is still numb.  She had EMGs by Dr. Daisey Dryer.  She has severe carpal tunnel on the left hand.  I have explained the findings.  I have recommended carpal tunnel surgery.  I have discussed the procedure with her.  She has decreased sensation of the left hand median nerve.  She also has underlying rheumatoid arthritis.  ROM of the wrist is good on the left.  Encounter Diagnosis  Name Primary?   Carpal tunnel syndrome, left upper limb Yes   I will have her see Dr. Ernesta Heading or Dr. Phyllis Breeze for surgery.  I have reviewed Dr. Ona Bidding notes.  Call if any problem.  Precautions discussed.  Electronically Signed Pleasant Brilliant, MD 6/4/202511:09 AM

## 2024-02-23 NOTE — Patient Instructions (Signed)
 Set up appt with Ernesta Heading or [facility] for CTS surgery. Pt wants first available.

## 2024-02-28 ENCOUNTER — Telehealth: Payer: Self-pay | Admitting: Orthopaedic Surgery

## 2024-02-28 NOTE — Telephone Encounter (Signed)
 Dr. Vicente Graham pt - pt lvm stating she was seen on 02/23/24 and she wants to speak to a nurse, she stated she needs some vital information.  [phone]

## 2024-02-29 ENCOUNTER — Other Ambulatory Visit (HOSPITAL_COMMUNITY)
Admission: RE | Admit: 2024-02-29 | Discharge: 2024-02-29 | Disposition: A | Discharge status: Home / Self Care | Source: Ambulatory Visit | Attending: Nephrology | Admitting: Nephrology

## 2024-02-29 DIAGNOSIS — E211 Secondary hyperparathyroidism, not elsewhere classified: Secondary | ICD-10-CM | POA: Insufficient documentation

## 2024-02-29 DIAGNOSIS — R809 Proteinuria, unspecified: Secondary | ICD-10-CM | POA: Diagnosis not present

## 2024-02-29 DIAGNOSIS — E559 Vitamin D deficiency, unspecified: Secondary | ICD-10-CM | POA: Diagnosis not present

## 2024-02-29 DIAGNOSIS — D631 Anemia in chronic kidney disease: Secondary | ICD-10-CM | POA: Diagnosis not present

## 2024-02-29 DIAGNOSIS — N189 Chronic kidney disease, unspecified: Secondary | ICD-10-CM | POA: Diagnosis not present

## 2024-02-29 LAB — RENAL FUNCTION PANEL
Albumin: 3.7 g/dL (ref 3.5–5.0)
Anion gap: 8 (ref 5–15)
BUN: 18 mg/dL (ref 8–23)
CO2: 25 mmol/L (ref 22–32)
Calcium: 9.1 mg/dL (ref 8.9–10.3)
Chloride: 103 mmol/L (ref 98–111)
Creatinine, Ser: 1.06 mg/dL — ABNORMAL HIGH (ref 0.44–1.00)
GFR, Estimated: 55 mL/min — ABNORMAL LOW (ref >60)
Glucose, Bld: 106 mg/dL — ABNORMAL HIGH (ref 70–99)
Phosphorus: 3.4 mg/dL (ref 2.5–4.6)
Potassium: 4 mmol/L (ref 3.5–5.1)
Sodium: 136 mmol/L (ref 135–145)

## 2024-02-29 LAB — PROTEIN / CREATININE RATIO, URINE
Creatinine, Urine: 205 mg/dL
Protein Creatinine Ratio: 0.07 mg/mg{creat} (ref 0.00–0.15)
Total Protein, Urine: 14 mg/dL

## 2024-02-29 LAB — CBC
HCT: 40.2 % (ref 36.0–46.0)
Hemoglobin: 13 g/dL (ref 12.0–15.0)
MCH: 25.8 pg — ABNORMAL LOW (ref 26.0–34.0)
MCHC: 32.3 g/dL (ref 30.0–36.0)
MCV: 79.9 fL — ABNORMAL LOW (ref 80.0–100.0)
Platelets: 344 10*3/uL (ref 150–400)
RBC: 5.03 MIL/uL (ref 3.87–5.11)
RDW: 14.6 % (ref 11.5–15.5)
WBC: 6.1 10*3/uL (ref 4.0–10.5)
nRBC: 0 % (ref 0.0–0.2)

## 2024-02-29 LAB — VITAMIN D 25 HYDROXY (VIT D DEFICIENCY, FRACTURES): Vit D, 25-Hydroxy: 31.14 ng/mL (ref 30–100)

## 2024-02-29 NOTE — Telephone Encounter (Signed)
 Left message to return my call.

## 2024-03-01 ENCOUNTER — Ambulatory Visit: Admitting: Orthopedic Surgery

## 2024-03-01 ENCOUNTER — Encounter: Payer: Self-pay | Admitting: Orthopedic Surgery

## 2024-03-01 VITALS — BP 128/80 | HR 50 | Ht 67.0 in | Wt 212.0 lb

## 2024-03-01 DIAGNOSIS — N1831 Chronic kidney disease, stage 3a: Secondary | ICD-10-CM | POA: Diagnosis not present

## 2024-03-01 DIAGNOSIS — I129 Hypertensive chronic kidney disease with stage 1 through stage 4 chronic kidney disease, or unspecified chronic kidney disease: Secondary | ICD-10-CM | POA: Diagnosis not present

## 2024-03-01 DIAGNOSIS — G5602 Carpal tunnel syndrome, left upper limb: Secondary | ICD-10-CM

## 2024-03-01 DIAGNOSIS — N200 Calculus of kidney: Secondary | ICD-10-CM | POA: Diagnosis not present

## 2024-03-01 MED ORDER — VITAMIN B-6 100 MG PO TABS
100.0000 mg | ORAL_TABLET | Freq: Two times a day (BID) | ORAL | 0 refills | Status: AC
Start: 2024-03-01 — End: ?

## 2024-03-01 NOTE — Telephone Encounter (Signed)
 Pt was seen in of[facility]ce today

## 2024-03-01 NOTE — Progress Notes (Signed)
  Intake history:  BP (!) 175/88   Pulse (!) 50   Ht 5' 7 (1.702 m)   Wt 212 lb (96.2 kg)   BMI 33.20 kg/m  Body mass index is 33.2 kg/m.    WHAT ARE WE SEEING YOU FOR TODAY?   left hand(s)  How long has this bothered you? (DOI?DOS?WS?) Couple months   Anticoag.  No  Diabetes No  Heart disease Yes  Hypertension Yes  SMOKING HX No  Kidney disease yes    Latest Ref Rng & Units 02/29/2024   10:10 AM  CMP  Glucose 70 - 99 mg/dL 657   BUN 8 - 23 mg/dL 18   Creatinine 8.46 - 1.00 mg/dL 9.62   Sodium 952 - 841 mmol/L 136   Potassium 3.5 - 5.1 mmol/L 4.0   Chloride 98 - 111 mmol/L 103   CO2 22 - 32 mmol/L 25   Calcium 8.9 - 10.3 mg/dL 9.1      Any ALLERGIES ______________________________________________   Treatment:  Have you taken:  Tylenol No  Advil No  Had PT No  Had injection No  Other  _________________________

## 2024-03-01 NOTE — Progress Notes (Signed)
 Chief Complaint  Patient presents with   Carpal Tunnel    Left/ surgical consult     Melinda Murphy is here to evaluate her left carpal tunnel.  She has left carpal tunnel syndrome by EMG nerve conduction study and has symptoms of numbness in the thumb index long and ring finger with no pain  She indicates that she would like to try nonoperative treatment  We discussed bracing, gabapentin, vitamin B6, and injection  She says she has kidney failure does not want to take gabapentin but she is amenable to starting the 600 mg twice a day  She denies any functional deficits at this time despite her nerve conduction studies indicating severe carpal tunnel   Past Medical History:  Diagnosis Date   Hypertension    History reviewed. No pertinent surgical history. Social History   Tobacco Use   Smoking status: Never   Smokeless tobacco: Never  Vaping Use   Vaping status: Never Used  Substance Use Topics   Alcohol use: Never   Drug use: Never   BP 128/80 Comment: Dr Alda Hummer office today this morning accurate per patient  Pulse (!) 50   Ht 5' 7 (1.702 m)   Wt 212 lb (96.2 kg)   BMI 33.20 kg/m   Physical Exam  The left hand shows no atrophy of the thenar or hypothenar muscles.  She cannot quite make a full fist I do not think this is related to carpal tunnel just arthritis She feels as if when touching the fingers that they are hot Otherwise sensation and feels normal Color and capillary refill are normal  She has no pain or increased numbness with carpal tunnel compression testing  Procedures: No procedures performed  EMG & NCV Findings: Evaluation of the left median motor nerve showed prolonged distal onset latency (7.5 ms), reduced amplitude (0.3 mV), and decreased conduction velocity (Elbow-Wrist, 44 m/s).  The left median (across palm) sensory nerve showed prolonged distal peak latency (Wrist, 11.9 ms), reduced amplitude (9.4 V), and prolonged distal peak latency (Palm, 6.0  ms).  All remaining nerves (as indicated in the following tables) were within normal limits.     Needle evaluation of the left abductor pollicis brevis muscle showed increased insertional activity and slightly increased spontaneous activity.  All remaining muscles (as indicated in the following table) showed no evidence of electrical instability.     Impression: The above electrodiagnostic study is ABNORMAL and reveals evidence of a severe left median nerve entrapment at the wrist (carpal tunnel syndrome) affecting sensory and motor components.   Assessment and plan  Carpal tunnel syndrome of the left wrist  Recommend bracing for 6 weeks.  The patient only wants to wear the brace for 3 weeks.  So we will come back in 3 weeks and see if she improved after wearing a brace and starting B6 100 mg twice a day

## 2024-03-01 NOTE — Patient Instructions (Signed)
 Take Vitamin B6 over the counter to see if it helps your carpal tunnel syndrome

## 2024-03-02 LAB — PARATHYROID HORMONE, INTACT (NO CA): PTH: 26 pg/mL (ref 15–65)

## 2024-03-03 DIAGNOSIS — I10 Essential (primary) hypertension: Secondary | ICD-10-CM | POA: Diagnosis not present

## 2024-03-03 DIAGNOSIS — M25561 Pain in right knee: Secondary | ICD-10-CM | POA: Diagnosis not present

## 2024-03-23 ENCOUNTER — Ambulatory Visit: Admitting: Orthopedic Surgery

## 2024-04-02 DIAGNOSIS — I10 Essential (primary) hypertension: Secondary | ICD-10-CM | POA: Diagnosis not present

## 2024-04-02 DIAGNOSIS — M25561 Pain in right knee: Secondary | ICD-10-CM | POA: Diagnosis not present

## 2024-04-18 ENCOUNTER — Other Ambulatory Visit: Payer: Self-pay | Admitting: Nephrology

## 2024-04-18 ENCOUNTER — Other Ambulatory Visit (HOSPITAL_COMMUNITY): Payer: Self-pay | Admitting: Nephrology

## 2024-04-18 DIAGNOSIS — I10 Essential (primary) hypertension: Secondary | ICD-10-CM

## 2024-04-18 DIAGNOSIS — N1832 Chronic kidney disease, stage 3b: Secondary | ICD-10-CM

## 2024-04-18 DIAGNOSIS — N1831 Chronic kidney disease, stage 3a: Secondary | ICD-10-CM | POA: Diagnosis not present

## 2024-04-20 ENCOUNTER — Other Ambulatory Visit (HOSPITAL_COMMUNITY): Payer: Self-pay | Admitting: Gerontology

## 2024-04-20 DIAGNOSIS — N951 Menopausal and female climacteric states: Secondary | ICD-10-CM

## 2024-04-26 ENCOUNTER — Ambulatory Visit (HOSPITAL_COMMUNITY)
Admission: RE | Admit: 2024-04-26 | Discharge: 2024-04-26 | Disposition: A | Discharge status: Home / Self Care | Source: Ambulatory Visit | Attending: Gerontology | Admitting: Gerontology

## 2024-04-26 DIAGNOSIS — N951 Menopausal and female climacteric states: Secondary | ICD-10-CM | POA: Diagnosis present

## 2024-04-26 DIAGNOSIS — N1832 Chronic kidney disease, stage 3b: Secondary | ICD-10-CM | POA: Diagnosis not present

## 2024-04-26 DIAGNOSIS — M85852 Other specified disorders of bone density and structure, left thigh: Secondary | ICD-10-CM | POA: Diagnosis not present

## 2024-04-26 DIAGNOSIS — I10 Essential (primary) hypertension: Secondary | ICD-10-CM | POA: Insufficient documentation

## 2024-04-26 DIAGNOSIS — Z780 Asymptomatic menopausal state: Secondary | ICD-10-CM | POA: Diagnosis not present

## 2024-04-27 ENCOUNTER — Ambulatory Visit (HOSPITAL_COMMUNITY)
Admission: RE | Admit: 2024-04-27 | Discharge: 2024-04-27 | Disposition: A | Discharge status: Home / Self Care | Source: Ambulatory Visit | Attending: Nephrology | Admitting: Nephrology

## 2024-04-27 DIAGNOSIS — I10 Essential (primary) hypertension: Secondary | ICD-10-CM | POA: Diagnosis not present

## 2024-04-27 DIAGNOSIS — N1832 Chronic kidney disease, stage 3b: Secondary | ICD-10-CM | POA: Diagnosis not present

## 2024-04-27 DIAGNOSIS — N183 Chronic kidney disease, stage 3 unspecified: Secondary | ICD-10-CM | POA: Diagnosis not present

## 2024-04-27 DIAGNOSIS — N200 Calculus of kidney: Secondary | ICD-10-CM | POA: Diagnosis not present

## 2024-04-28 DIAGNOSIS — I10 Essential (primary) hypertension: Secondary | ICD-10-CM | POA: Diagnosis not present

## 2024-04-28 DIAGNOSIS — E782 Mixed hyperlipidemia: Secondary | ICD-10-CM | POA: Diagnosis not present

## 2024-04-28 DIAGNOSIS — R7303 Prediabetes: Secondary | ICD-10-CM | POA: Diagnosis not present

## 2024-04-28 DIAGNOSIS — N1831 Chronic kidney disease, stage 3a: Secondary | ICD-10-CM | POA: Diagnosis not present

## 2024-05-03 DIAGNOSIS — Z0001 Encounter for general adult medical examination with abnormal findings: Secondary | ICD-10-CM | POA: Diagnosis not present

## 2024-05-03 DIAGNOSIS — E7849 Other hyperlipidemia: Secondary | ICD-10-CM | POA: Diagnosis not present

## 2024-05-03 DIAGNOSIS — M8588 Other specified disorders of bone density and structure, other site: Secondary | ICD-10-CM | POA: Diagnosis not present

## 2024-05-03 DIAGNOSIS — Z1389 Encounter for screening for other disorder: Secondary | ICD-10-CM | POA: Diagnosis not present

## 2024-05-03 DIAGNOSIS — I10 Essential (primary) hypertension: Secondary | ICD-10-CM | POA: Diagnosis not present

## 2024-05-23 DIAGNOSIS — I10 Essential (primary) hypertension: Secondary | ICD-10-CM | POA: Diagnosis not present

## 2024-05-30 DIAGNOSIS — H1033 Unspecified acute conjunctivitis, bilateral: Secondary | ICD-10-CM | POA: Diagnosis not present

## 2024-05-30 DIAGNOSIS — I10 Essential (primary) hypertension: Secondary | ICD-10-CM | POA: Diagnosis not present

## 2024-05-30 DIAGNOSIS — N1831 Chronic kidney disease, stage 3a: Secondary | ICD-10-CM | POA: Diagnosis not present

## 2024-06-03 DIAGNOSIS — I10 Essential (primary) hypertension: Secondary | ICD-10-CM | POA: Diagnosis not present

## 2024-06-03 DIAGNOSIS — M25561 Pain in right knee: Secondary | ICD-10-CM | POA: Diagnosis not present

## 2024-07-03 DIAGNOSIS — M25561 Pain in right knee: Secondary | ICD-10-CM | POA: Diagnosis not present

## 2024-07-03 DIAGNOSIS — I10 Essential (primary) hypertension: Secondary | ICD-10-CM | POA: Diagnosis not present

## 2024-07-07 DIAGNOSIS — H04123 Dry eye syndrome of bilateral lacrimal glands: Secondary | ICD-10-CM | POA: Diagnosis not present

## 2024-07-07 DIAGNOSIS — H04212 Epiphora due to excess lacrimation, left lacrimal gland: Secondary | ICD-10-CM | POA: Diagnosis not present

## 2024-07-26 ENCOUNTER — Telehealth: Payer: Self-pay | Admitting: *Deleted

## 2024-07-26 NOTE — Telephone Encounter (Signed)
 Ms. Bumgardner called wanting to schedule appt. W/Harrison it was a new problem and she could not get in touch with [facility]. Can someone please give her a call back. [phone] She said phone just rang did not get a Vm Thanks

## 2024-08-07 ENCOUNTER — Encounter: Admitting: Orthopedic Surgery

## 2024-09-27 ENCOUNTER — Encounter: Payer: Self-pay | Admitting: Surgical

## 2024-09-27 ENCOUNTER — Other Ambulatory Visit: Payer: Self-pay

## 2024-09-27 ENCOUNTER — Ambulatory Visit: Admitting: Surgical

## 2024-09-27 DIAGNOSIS — M25561 Pain in right knee: Secondary | ICD-10-CM

## 2024-09-27 DIAGNOSIS — M059 Rheumatoid arthritis with rheumatoid factor, unspecified: Secondary | ICD-10-CM | POA: Diagnosis not present

## 2024-09-27 DIAGNOSIS — M1711 Unilateral primary osteoarthritis, right knee: Secondary | ICD-10-CM

## 2024-09-27 NOTE — Progress Notes (Signed)
 "  Office Visit Note   Patient: Melinda Murphy           Date of Birth: 08-02-1951           MRN: [id number] Visit Date: 09/27/2024 Requested by: Carlette Benita Area, MD [address] [place],  [place] [address] PCP: Carlette Benita Area, MD  Subjective: Chief Complaint  Patient presents with   bilateral knee pain    HPI: Melinda Murphy is a 74 y.o. female who presents to the office reporting right knee pain.  Patient states that she has had worsening right knee pain over the last month without any recent injury.  She describes a history of rheumatoid arthritis.  She does not take any medications for her rheumatoid arthritis.  Currently does not have a rheumatologist but has seen someone in the past who recommended Plaquenil but patient was concerned about possible side effects and opted to avoid this and never followed up.  She has had prior Durolane injections a few years ago which did not provide much relief.  She has also had prior cortisone injections that only gave her about 3 weeks of relief.  She describes stiffness in both knees as well as swelling.  She does not really have much pain with sitting or laying but has 9/10 pain with standing.  No history of prior surgery to the knees.  Has not had physical therapy before.  Denies any history of diabetes but does have CKD.              ROS: All systems reviewed are negative as they relate to the chief complaint within the history of present illness.  Patient denies fevers or chills.  Assessment & Plan: Visit Diagnoses:  1. Acute pain of right knee   2. Rheumatoid arthritis, seropositive (HCC)     Plan: Impression is 74 year old female with history of rheumatoid arthritis who has right knee arthritis (as well as left knee arthritis noted on AP radiographs of the right knee).  We discussed options available to patient including but not limited to physical therapy versus gel injection versus cortisone injection versus  pursuing management of her RA with her rheumatologist.  Plan to refer her to rheumatology as well as try bilateral knee gel injections and physical therapy at [facility].  Patient understands that there is a chance she will not get relief from these gel injections since she did not have much relief from prior gel injections.  Still think it is worth trying since she does not want to do cortisone injections because she is concerned about the side effects.  She does not want to pursue any surgical intervention at this time.  Follow-up after gel injection approval.  This patient is diagnosed with osteoarthritis of the knee(s).    Radiographs show evidence of joint space narrowing, osteophytes, subchondral sclerosis and/or subchondral cysts.  This patient has knee pain which interferes with functional and activities of daily living.    This patient has experienced inadequate response, adverse effects and/or intolerance with conservative treatments such as acetaminophen, NSAIDS, topical creams, physical therapy or regular exercise, knee bracing and/or weight loss.   This patient has experienced inadequate response or has a contraindication to intra articular steroid injections for at least 3 months.   This patient is not scheduled to have a total knee replacement within 6 months of starting treatment with viscosupplementation.   Follow-Up Instructions: No follow-ups on file.   Orders:  Orders Placed This Encounter  Procedures   DG Knee 1-2 Views Right   Ambulatory request for injection medication   Ambulatory referral to Rheumatology   Ambulatory referral to Physical Therapy   No orders of the defined types were placed in this encounter.     Procedures: No procedures performed   Clinical Data: No additional findings.  Objective: Vital Signs: There were no vitals taken for this visit.  Physical Exam:  Constitutional: Patient appears well-developed HEENT:  Head:  Normocephalic Eyes:EOM are normal Neck: Normal range of motion Cardiovascular: Normal rate Pulmonary/chest: Effort normal Neurologic: Patient is alert Skin: Skin is warm Psychiatric: Patient has normal mood and affect  Ortho Exam: Ortho exam demonstrates right knee with moderate effusion.  No cellulitis or skin changes noted.  She has 3 degrees extension and 95 to 100 degrees of knee flexion.  No pain with hip range of motion.  She does have mild to moderate tenderness over the medial and lateral joint lines.  No calf tenderness.  Negative Homan sign.  Palpable PT pulse of the right lower extremity.  Able to perform straight leg raise without extensor lag.  Excellent quad strength rated 5/5.  No tenderness over the tibial tubercle or pes anserine bursa.  Specialty Comments:  No specialty comments available.  Imaging: No results found.   PMFS History: Patient Active Problem List   Diagnosis Date Noted   Mild mitral regurgitation 10/18/2023   Palpitations 10/18/2023   HTN (hypertension) 10/18/2023   Rheumatoid arthritis (HCC) 06/22/2023   Past Medical History:  Diagnosis Date   Hypertension     Family History  Problem Relation Age of Onset   Aneurysm Mother     No past surgical history on file. Social History   Occupational History   Not on file  Tobacco Use   Smoking status: Never   Smokeless tobacco: Never  Vaping Use   Vaping status: Never Used  Substance and Sexual Activity   Alcohol use: Never   Drug use: Never   Sexual activity: Not Currently    Birth control/protection: Abstinence        "

## 2024-09-29 ENCOUNTER — Telehealth: Payer: Self-pay | Admitting: Physician Assistant

## 2024-09-29 NOTE — Telephone Encounter (Signed)
 Pt called saying that she got a call from Ronal Caldron, but that she didn't leave a message. She also mentioned Melinda Murphy. Call back number is [phone].

## 2024-10-27 ENCOUNTER — Other Ambulatory Visit: Payer: Self-pay

## 2024-10-27 DIAGNOSIS — M170 Bilateral primary osteoarthritis of knee: Secondary | ICD-10-CM
